# Patient Record
Sex: Male | Born: 1945 | Race: White | Hispanic: No | Marital: Married | Smoking: Former smoker
Health system: Southern US, Community
[De-identification: ages and names within clinical notes are randomized; demographics above are authoritative.]

## PROBLEM LIST (undated history)

## (undated) DIAGNOSIS — E119 Type 2 diabetes mellitus without complications: Secondary | ICD-10-CM

## (undated) DIAGNOSIS — F32A Depression, unspecified: Secondary | ICD-10-CM

## (undated) DIAGNOSIS — K219 Gastro-esophageal reflux disease without esophagitis: Secondary | ICD-10-CM

## (undated) DIAGNOSIS — F988 Other specified behavioral and emotional disorders with onset usually occurring in childhood and adolescence: Secondary | ICD-10-CM

## (undated) DIAGNOSIS — K589 Irritable bowel syndrome without diarrhea: Secondary | ICD-10-CM

## (undated) DIAGNOSIS — F419 Anxiety disorder, unspecified: Secondary | ICD-10-CM

## (undated) DIAGNOSIS — F329 Major depressive disorder, single episode, unspecified: Secondary | ICD-10-CM

## (undated) HISTORY — PX: OTHER SURGICAL HISTORY: SHX169

## (undated) HISTORY — PX: KNEE SURGERY: SHX244

## (undated) HISTORY — PX: APPENDECTOMY: SHX54

---

## 2012-05-18 HISTORY — PX: SHOULDER SURGERY: SHX246

## 2012-06-07 ENCOUNTER — Other Ambulatory Visit: Payer: Self-pay | Admitting: Orthopedic Surgery

## 2012-06-07 DIAGNOSIS — M25511 Pain in right shoulder: Secondary | ICD-10-CM

## 2012-06-11 ENCOUNTER — Ambulatory Visit
Admission: RE | Admit: 2012-06-11 | Discharge: 2012-06-11 | Disposition: A | Discharge status: Home / Self Care | Payer: No Typology Code available for payment source | Source: Ambulatory Visit | Attending: Orthopedic Surgery | Admitting: Orthopedic Surgery

## 2012-06-11 DIAGNOSIS — M25511 Pain in right shoulder: Secondary | ICD-10-CM

## 2012-07-28 ENCOUNTER — Emergency Department (HOSPITAL_COMMUNITY): Payer: Medicare Other

## 2012-07-28 ENCOUNTER — Inpatient Hospital Stay (HOSPITAL_COMMUNITY): Payer: Medicare Other

## 2012-07-28 ENCOUNTER — Inpatient Hospital Stay (HOSPITAL_COMMUNITY)
Admission: EM | Admit: 2012-07-28 | Discharge: 2012-07-30 | DRG: 917 | Disposition: A | Discharge status: Home / Self Care | Payer: Medicare Other | Attending: Internal Medicine | Admitting: Internal Medicine

## 2012-07-28 ENCOUNTER — Encounter (HOSPITAL_COMMUNITY): Payer: Self-pay | Admitting: Internal Medicine

## 2012-07-28 DIAGNOSIS — T400X1A Poisoning by opium, accidental (unintentional), initial encounter: Secondary | ICD-10-CM | POA: Diagnosis present

## 2012-07-28 DIAGNOSIS — F419 Anxiety disorder, unspecified: Secondary | ICD-10-CM | POA: Diagnosis present

## 2012-07-28 DIAGNOSIS — T40601A Poisoning by unspecified narcotics, accidental (unintentional), initial encounter: Secondary | ICD-10-CM

## 2012-07-28 DIAGNOSIS — R339 Retention of urine, unspecified: Secondary | ICD-10-CM | POA: Diagnosis present

## 2012-07-28 DIAGNOSIS — F988 Other specified behavioral and emotional disorders with onset usually occurring in childhood and adolescence: Secondary | ICD-10-CM | POA: Diagnosis present

## 2012-07-28 DIAGNOSIS — Y92009 Unspecified place in unspecified non-institutional (private) residence as the place of occurrence of the external cause: Secondary | ICD-10-CM

## 2012-07-28 DIAGNOSIS — K219 Gastro-esophageal reflux disease without esophagitis: Secondary | ICD-10-CM | POA: Diagnosis present

## 2012-07-28 DIAGNOSIS — Z9889 Other specified postprocedural states: Secondary | ICD-10-CM

## 2012-07-28 DIAGNOSIS — R0989 Other specified symptoms and signs involving the circulatory and respiratory systems: Secondary | ICD-10-CM | POA: Diagnosis present

## 2012-07-28 DIAGNOSIS — R0609 Other forms of dyspnea: Secondary | ICD-10-CM | POA: Diagnosis present

## 2012-07-28 DIAGNOSIS — G92 Toxic encephalopathy: Secondary | ICD-10-CM | POA: Diagnosis present

## 2012-07-28 DIAGNOSIS — K589 Irritable bowel syndrome without diarrhea: Secondary | ICD-10-CM | POA: Diagnosis present

## 2012-07-28 DIAGNOSIS — E119 Type 2 diabetes mellitus without complications: Secondary | ICD-10-CM | POA: Diagnosis present

## 2012-07-28 DIAGNOSIS — G8918 Other acute postprocedural pain: Secondary | ICD-10-CM

## 2012-07-28 DIAGNOSIS — F329 Major depressive disorder, single episode, unspecified: Secondary | ICD-10-CM

## 2012-07-28 DIAGNOSIS — F411 Generalized anxiety disorder: Secondary | ICD-10-CM

## 2012-07-28 DIAGNOSIS — R739 Hyperglycemia, unspecified: Secondary | ICD-10-CM

## 2012-07-28 DIAGNOSIS — F32A Depression, unspecified: Secondary | ICD-10-CM | POA: Diagnosis present

## 2012-07-28 DIAGNOSIS — R4182 Altered mental status, unspecified: Secondary | ICD-10-CM

## 2012-07-28 DIAGNOSIS — Z79899 Other long term (current) drug therapy: Secondary | ICD-10-CM

## 2012-07-28 DIAGNOSIS — G929 Unspecified toxic encephalopathy: Secondary | ICD-10-CM | POA: Diagnosis present

## 2012-07-28 DIAGNOSIS — F3289 Other specified depressive episodes: Secondary | ICD-10-CM | POA: Diagnosis present

## 2012-07-28 HISTORY — DX: Other specified behavioral and emotional disorders with onset usually occurring in childhood and adolescence: F98.8

## 2012-07-28 HISTORY — DX: Type 2 diabetes mellitus without complications: E11.9

## 2012-07-28 HISTORY — DX: Gastro-esophageal reflux disease without esophagitis: K21.9

## 2012-07-28 HISTORY — DX: Irritable bowel syndrome, unspecified: K58.9

## 2012-07-28 HISTORY — DX: Anxiety disorder, unspecified: F41.9

## 2012-07-28 HISTORY — DX: Depression, unspecified: F32.A

## 2012-07-28 HISTORY — DX: Major depressive disorder, single episode, unspecified: F32.9

## 2012-07-28 LAB — CBC
MCH: 31.4 pg (ref 26.0–34.0)
MCV: 87.9 fL (ref 78.0–100.0)
Platelets: 137 10*3/uL — ABNORMAL LOW (ref 150–400)
RDW: 12.1 % (ref 11.5–15.5)
WBC: 13.2 10*3/uL — ABNORMAL HIGH (ref 4.0–10.5)

## 2012-07-28 LAB — URINE MICROSCOPIC-ADD ON

## 2012-07-28 LAB — ACETAMINOPHEN LEVEL: Acetaminophen (Tylenol), Serum: <15 ug/mL (ref 10–30)

## 2012-07-28 LAB — MRSA PCR SCREENING: MRSA by PCR: NEGATIVE

## 2012-07-28 LAB — POCT I-STAT, CHEM 8
Calcium, Ion: 1.09 mmol/L — ABNORMAL LOW (ref 1.13–1.30)
Chloride: 98 mEq/L (ref 96–112)
HCT: 48 % (ref 39.0–52.0)
Potassium: 4.4 mEq/L (ref 3.5–5.1)

## 2012-07-28 LAB — GLUCOSE, CAPILLARY
Glucose-Capillary: 212 mg/dL — ABNORMAL HIGH (ref 70–99)
Glucose-Capillary: 155 mg/dL — ABNORMAL HIGH (ref 70–99)

## 2012-07-28 LAB — URINALYSIS, ROUTINE W REFLEX MICROSCOPIC
Leukocytes, UA: NEGATIVE
Nitrite: NEGATIVE
Specific Gravity, Urine: 1.03 (ref 1.005–1.030)
pH: 5.5 (ref 5.0–8.0)

## 2012-07-28 LAB — RAPID URINE DRUG SCREEN, HOSP PERFORMED: Barbiturates: NOT DETECTED

## 2012-07-28 MED ORDER — ENOXAPARIN SODIUM 40 MG/0.4ML ~~LOC~~ SOLN
40.0000 mg | Freq: Every day | SUBCUTANEOUS | Status: DC
  Administered 2012-07-28 – 2012-07-29 (×2): 40 mg via SUBCUTANEOUS
  Filled 2012-07-28 (×4): qty 0.4

## 2012-07-28 MED ORDER — ALPRAZOLAM 0.25 MG PO TABS: 0.2500 mg | ORAL_TABLET | Freq: Three times a day (TID) | ORAL | Status: DC | PRN

## 2012-07-28 MED ORDER — SODIUM CHLORIDE 0.9 % IJ SOLN
3.0000 mL | Freq: Two times a day (BID) | INTRAMUSCULAR | Status: DC
  Administered 2012-07-29 – 2012-07-30 (×3): 3 mL via INTRAVENOUS

## 2012-07-28 MED ORDER — METHYLPHENIDATE HCL 5 MG PO TABS
10.0000 mg | ORAL_TABLET | Freq: Two times a day (BID) | ORAL | Status: DC
  Administered 2012-07-29 – 2012-07-30 (×3): 10 mg via ORAL
  Filled 2012-07-28 (×2): qty 1
  Filled 2012-07-28 (×2): qty 2

## 2012-07-28 MED ORDER — SODIUM CHLORIDE 0.9 % IV BOLUS (SEPSIS)
500.0000 mL | Freq: Once | INTRAVENOUS | Status: AC
  Administered 2012-07-28: 500 mL via INTRAVENOUS

## 2012-07-28 MED ORDER — TIZANIDINE HCL 2 MG PO TABS
2.0000 mg | ORAL_TABLET | Freq: Four times a day (QID) | ORAL | Status: DC | PRN
  Filled 2012-07-28: qty 1

## 2012-07-28 MED ORDER — SENNOSIDES-DOCUSATE SODIUM 8.6-50 MG PO TABS
1.0000 | ORAL_TABLET | Freq: Every evening | ORAL | Status: DC | PRN
  Filled 2012-07-28: qty 1

## 2012-07-28 MED ORDER — NALOXONE HCL 0.4 MG/ML IJ SOLN
0.4000 mg | INTRAMUSCULAR | Status: DC | PRN
  Administered 2012-07-28: 0.4 mg via INTRAVENOUS
  Filled 2012-07-28: qty 1

## 2012-07-28 MED ORDER — METHYLPHENIDATE HCL 10 MG PO TABS: 10.0000 mg | ORAL_TABLET | Freq: Two times a day (BID) | ORAL | Status: DC

## 2012-07-28 MED ORDER — KETOROLAC TROMETHAMINE 30 MG/ML IJ SOLN
30.0000 mg | Freq: Four times a day (QID) | INTRAMUSCULAR | Status: DC
  Filled 2012-07-28 (×6): qty 1

## 2012-07-28 MED ORDER — ACETAMINOPHEN 325 MG PO TABS
650.0000 mg | ORAL_TABLET | Freq: Four times a day (QID) | ORAL | Status: DC | PRN
  Administered 2012-07-29 (×2): 650 mg via ORAL
  Filled 2012-07-28 (×4): qty 2

## 2012-07-28 MED ORDER — INSULIN ASPART 100 UNIT/ML ~~LOC~~ SOLN
0.0000 [IU] | Freq: Three times a day (TID) | SUBCUTANEOUS | Status: DC
  Administered 2012-07-28: 3 [IU] via SUBCUTANEOUS
  Administered 2012-07-29: 1 [IU] via SUBCUTANEOUS
  Administered 2012-07-29: 3 [IU] via SUBCUTANEOUS
  Administered 2012-07-29: 1 [IU] via SUBCUTANEOUS
  Administered 2012-07-30: 3 [IU] via SUBCUTANEOUS

## 2012-07-28 MED ORDER — SODIUM CHLORIDE 0.9 % IV SOLN
INTRAVENOUS | Status: DC
  Administered 2012-07-28 – 2012-07-29 (×3): via INTRAVENOUS

## 2012-07-28 MED ORDER — INSULIN ASPART 100 UNIT/ML ~~LOC~~ SOLN: 0.0000 [IU] | Freq: Every day | SUBCUTANEOUS | Status: DC

## 2012-07-28 MED ORDER — ONDANSETRON HCL 4 MG PO TABS: 4.0000 mg | ORAL_TABLET | Freq: Four times a day (QID) | ORAL | Status: DC | PRN

## 2012-07-28 MED ORDER — ACETAMINOPHEN 650 MG RE SUPP: 650.0000 mg | Freq: Four times a day (QID) | RECTAL | Status: DC | PRN

## 2012-07-28 MED ORDER — ONDANSETRON HCL 4 MG/2ML IJ SOLN: 4.0000 mg | Freq: Four times a day (QID) | INTRAMUSCULAR | Status: DC | PRN

## 2012-07-28 NOTE — ED Notes (Signed)
Patient states that he took 3 of his wife's MS Contin 30 mg tablets.

## 2012-07-28 NOTE — Progress Notes (Signed)
1830 Patient arrived to floor from ED via stretcher. Arousable but very drowsy. Placed on telemetry and bed alarm.
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
## 2012-07-28 NOTE — ED Notes (Signed)
Pt to department via EMS from home- pt reports he had surgery on his right shoulder yesterday. States he checked out AMA from rehab because pain medication was not strong enough and came home. Pt was checked on by house keeper and was lethargic with decreased RR. Ems states they gave 35m narcan and pt was more responsive. Bp-140/80 Hr-100

## 2012-07-28 NOTE — Progress Notes (Signed)
Pt has a foley but no order for a foley. RN paged md on call and is awaiting further orders.
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
## 2012-07-29 LAB — CBC
Hemoglobin: 14.8 g/dL (ref 13.0–17.0)
MCH: 30.3 pg (ref 26.0–34.0)
Platelets: 119 10*3/uL — ABNORMAL LOW (ref 150–400)
RBC: 4.89 MIL/uL (ref 4.22–5.81)
WBC: 10.1 10*3/uL (ref 4.0–10.5)

## 2012-07-29 LAB — BASIC METABOLIC PANEL
Calcium: 8.4 mg/dL (ref 8.4–10.5)
GFR calc non Af Amer: 89 mL/min — ABNORMAL LOW (ref >90)
Glucose, Bld: 163 mg/dL — ABNORMAL HIGH (ref 70–99)
Potassium: 3.9 mEq/L (ref 3.5–5.1)
Sodium: 137 mEq/L (ref 135–145)

## 2012-07-29 LAB — GLUCOSE, CAPILLARY
Glucose-Capillary: 140 mg/dL — ABNORMAL HIGH (ref 70–99)
Glucose-Capillary: 203 mg/dL — ABNORMAL HIGH (ref 70–99)

## 2012-07-29 LAB — HEMOGLOBIN A1C: Mean Plasma Glucose: 364 mg/dL — ABNORMAL HIGH (ref <117)

## 2012-07-29 MED ORDER — KETOROLAC TROMETHAMINE 30 MG/ML IJ SOLN
15.0000 mg | Freq: Four times a day (QID) | INTRAMUSCULAR | Status: DC
  Filled 2012-07-29 (×4): qty 1

## 2012-07-29 MED ORDER — HYDROCODONE-ACETAMINOPHEN 7.5-325 MG PO TABS: 1.0000 | ORAL_TABLET | Freq: Four times a day (QID) | ORAL | Status: DC | PRN

## 2012-07-29 MED ORDER — ACETAMINOPHEN 500 MG PO TABS
1000.0000 mg | ORAL_TABLET | Freq: Three times a day (TID) | ORAL | Status: DC
  Administered 2012-07-29 – 2012-07-30 (×3): 1000 mg via ORAL
  Filled 2012-07-29 (×7): qty 2

## 2012-07-29 MED ORDER — TAMSULOSIN HCL 0.4 MG PO CAPS
0.4000 mg | ORAL_CAPSULE | Freq: Every day | ORAL | Status: DC
  Administered 2012-07-29 – 2012-07-30 (×2): 0.4 mg via ORAL
  Filled 2012-07-29 (×2): qty 1

## 2012-07-29 MED ORDER — DOCUSATE SODIUM 100 MG PO CAPS
100.0000 mg | ORAL_CAPSULE | Freq: Two times a day (BID) | ORAL | Status: DC
  Administered 2012-07-29 – 2012-07-30 (×3): 100 mg via ORAL
  Filled 2012-07-29 (×4): qty 1

## 2012-07-29 MED ORDER — IBUPROFEN 200 MG PO TABS: 800.0000 mg | ORAL_TABLET | Freq: Four times a day (QID) | ORAL | Status: DC | PRN

## 2012-07-29 MED ORDER — OXYCODONE HCL 5 MG PO TABS
5.0000 mg | ORAL_TABLET | Freq: Four times a day (QID) | ORAL | Status: DC | PRN
  Administered 2012-07-30: 5 mg via ORAL
  Filled 2012-07-29: qty 1

## 2012-07-29 MED ORDER — DSS 100 MG PO CAPS: 100.0000 mg | ORAL_CAPSULE | Freq: Two times a day (BID) | ORAL | Status: DC

## 2012-07-29 MED ORDER — METHYLPHENIDATE HCL 10 MG PO TABS: 10.0000 mg | ORAL_TABLET | Freq: Two times a day (BID) | ORAL | Status: DC

## 2012-07-29 MED ORDER — SIMETHICONE 80 MG PO CHEW
80.0000 mg | CHEWABLE_TABLET | Freq: Four times a day (QID) | ORAL | Status: DC | PRN
  Filled 2012-07-29: qty 1

## 2012-07-29 MED ORDER — TIZANIDINE HCL 2 MG PO TABS: 2.0000 mg | ORAL_TABLET | Freq: Four times a day (QID) | ORAL | Status: DC | PRN

## 2012-07-29 MED ORDER — KETOROLAC TROMETHAMINE 15 MG/ML IJ SOLN
15.0000 mg | Freq: Four times a day (QID) | INTRAMUSCULAR | Status: DC
  Filled 2012-07-29 (×4): qty 1

## 2012-07-29 MED ORDER — SENNOSIDES-DOCUSATE SODIUM 8.6-50 MG PO TABS
1.0000 | ORAL_TABLET | Freq: Every day | ORAL | Status: DC
  Administered 2012-07-29: 1 via ORAL
  Filled 2012-07-29 (×2): qty 1

## 2012-07-29 MED ORDER — PANTOPRAZOLE SODIUM 40 MG PO TBEC
40.0000 mg | DELAYED_RELEASE_TABLET | Freq: Every day | ORAL | Status: DC
  Administered 2012-07-30: 40 mg via ORAL
  Filled 2012-07-29: qty 1

## 2012-07-29 MED ORDER — PIOGLITAZONE HCL 15 MG PO TABS
15.0000 mg | ORAL_TABLET | Freq: Every day | ORAL | Status: DC
  Filled 2012-07-29 (×2): qty 1

## 2012-07-29 MED ORDER — LIVING WELL WITH DIABETES BOOK
Freq: Once | Status: AC
  Administered 2012-07-29: 19:00:00
  Filled 2012-07-29 (×2): qty 1

## 2012-07-29 NOTE — Progress Notes (Signed)
RN found a bag of medications in the patient's room. The medications were some of the medications that the patient used to overdose on. Pt had some of his deceased wife's medications in the bag. Medications were counted and sent down to pharmacy.

## 2012-07-29 NOTE — Care Management Note (Unsigned)
    Page 1 of 2   07/29/2012     3:11:24 PM   CARE MANAGEMENT NOTE 07/29/2012  Patient:  Shane Casey, Shane Casey   Account Number:  [id number]  Date Initiated:  07/29/2012  Documentation initiated by:  Letha Cape  Subjective/Objective Assessment:   dx opiate od  admit- lives alone.     Action/Plan:   hhrn   Anticipated DC Date:  07/30/2012   Anticipated DC Plan:  HOME W HOME HEALTH SERVICES      DC Planning Services  CM consult      [facility] Choice  HOME HEALTH   Choice offered to / List presented to:  C-1 Patient        HH arranged  HH-1 RN  HH-4 NURSE'S AIDE      HH agency  [facility]   Status of service:  In process, will continue to follow Medicare Important Message given?   (If response is "NO", the following Medicare IM given date fields will be blank) Date Medicare IM given:   Date Additional Medicare IM given:    Discharge Disposition:    Per UR Regulation:  Reviewed for med. necessity/level of care/duration of stay  If discussed at Long Length of Stay Meetings, dates discussed:    Comments:  07/29/12 12:27 Letha Cape RN, BSN [phone] patient lives alone, he has a housekeeper , Shane Casey [phone], who states she will stay will him Sat night and she will transport him from the hospital to home on Sat as well.  Shane Casey states that she will not be able to stay with him on Sunday, but pt states he has a friend who is a trainer that he can ask to stay with him.  Also we will check the private duty list to see if they have someone to stay overnight with patient, he states he is willing to pay $10-$15 /hr, but will just need someone from Sunday to Monday.  I will check into some of the private duty agencies.  Patient chose [facility] from agency list, referral made to [name] , Shane Casey notified for [facility] and aide.   Per physical therapy no pt  needs.  I called [facility]  [phone] , they state they can have someone to sit with patient on Sunday night thru Monday and  it will be $17-$18 /hr, the patient will need to call them on Saturday when he gets home so they can come and do an assessment.  I gave the pt the private duty list and this information to call [facility] for a sitter on Sunday night.

## 2012-07-29 NOTE — Progress Notes (Signed)
PT Cancellation Note  Patient Details Name: Shane Casey MRN: [id number] DOB: 09/07/1945   Cancelled Treatment:    Reason Eval/Treat Not Completed: Other (comment) (Pt amb in hall without difficulty. No PT eval needed.) Recommend pt follow-up with orthopedist for any further PT needed for shoulder.   MAYCOCK,CARY 07/29/2012, 12:35 PM

## 2012-07-29 NOTE — Progress Notes (Signed)
Foley was removed per MD orders. Pt tolerated well

## 2012-07-29 NOTE — Progress Notes (Signed)
Inpatient Diabetes Program Recommendations  AACE/ADA: New Consensus Statement on Inpatient Glycemic Control (2013)  Target Ranges:  Prepandial:   less than 140 mg/dL      Peak postprandial:   less than 180 mg/dL (1-2 hours)      Critically ill patients:  140 - 180 mg/dL   Patient admitted with unintentional opiate overdose.  Has history of type 2 diabetes.  No mention of diabetes medications at home per med rec.  Results for Shane Casey, Shane Casey (MRN [id number]) as of 07/29/2012 14:31  Ref. Range 07/28/2012 14:14  Hemoglobin A1C Latest Range: <5.7 % 14.3 (H)   A1c shows extremely poor glucose control at home.    Discussed A1C results with him and explained what an A1C is and what it measures.  Also discussed basic home care and importance of checking CBGs and maintaining good CBG control to prevent long-term and short-term complications.  Discussed basic pathophysiology of diabetes and the chronic complications that can ensue from uncontrolled CBGs.  RNs to provide ongoing basic DM education at bedside with this patient.   Patient really seemed to have a major lack of understanding of his diabetes.  Patient told me he had taken Glucophage and Amaryl that was prescribed by his PCP, however, he stopped taking these medications a long time ago b/c they gave him "really bad diarrhea".  Patient also told me he really hasn't done anything about his diabetes b/c he really doesn't understand it.  Patient asked me if he could go see a diabetes specialist.  I went ahead and made an appointment with Dr. Ernest Haber with [facility] for March 28 at 8am.  Will also place a consult for outpatient diabetes education for this patient at the [facility] Nutrition and DM management center.   Will follow. Ambrose Finland RN, MSN, CDE Diabetes Coordinator Inpatient Diabetes Program [phone]

## 2012-07-29 NOTE — Discharge Summary (Signed)
Physician Discharge Summary  Draylon Mercadel WJX:[id number] DOB: 01/02/1946 DOA: 07/28/2012  PCP: Pamelia Hoit, MD  Admit date: 07/28/2012 Discharge date: 07/30/2012  Time spent: 60 minutes  Recommendations for Outpatient Follow-up:  Patient will be discharged with full home health services (RN, PT, Aide, Social Worker) Follow up with Dr. Madelon Lips 3/17 at 8:45 am for post surgical evaluation  Discharge Diagnoses:  Principal Problem:   Opiate overdose-unintentional Active Problems:   ADD (attention deficit disorder) without hyperactivity   Depression   Anxiety   DM type 2 (diabetes mellitus, type 2)   GERD (gastroesophageal reflux disease)   IBS (irritable bowel syndrome)   S/P shoulder surgery   Urinary retention   Discharge Condition: stable.  Diet recommendation: diabetic diet  Filed Weights   07/28/12 1445 07/28/12 1830  Weight: 89.359 kg (197 lb) 89.4 kg (197 lb 1.5 oz)    History of present illness:  Atlee Kluth is a 67 y.o. male with history of attention deficit disorder, depression, anxiety, untreated diabetes mellitus type 2, who underwent a right shoulder surgery July 27, 2012. Postoperatively the patient became combative and fought the staff at the orthopedic at the surgical center for 2 hours straight. Eventually he was released home. Apparently he arrived at his house and took an unspecified amount of MS Contin which he had stored previously for "rainy days". The medication belonged to his deceased wife (who passed in 1995-10-25). EMS arrived at his house and found him with agonal breathing. Patient was given Narcan and he was transported to the emergency room. Hospitalist service was called to admit this patient  Hospital Course:  Opiate Overdose: Mr. Hutzler received several doses of narcan.  He was placed on IV fluids, telemetry monitoring, and continuous pulse oximetry. The morning after admission he is alert and orientated. Vital signs are appropriate.  He  reports he has no suicidal ideations, but rather took the medication because he was in intractable pain. He was placed on IV toradol initially, this was then discontinued and patient was placed on scheduled Tylenol and as needed oxycodone. Over the past 24 hours-pain seems to be adequately controlled, he has just use one dose of 5 mg of Oxycodone for breakthrough. He has been ambulating in the room without difficulty.  He had all his medications here in the hospital-they are with the pharmacy staff-I have instructed the RN to dispose of all the narcotics-as they apparently  his late wife's supply, per RN these were already past their expiration date. He has been asked to take Tylenol and prn Ibuprofen for pain. He will be provided with 15 tablets of Oxycodone for breakthrough.He lives alone, but he has a housekeeper that will pick him up and stay with him for the next few days. He has been extensively educated by me today and yesterday to be very careful with narcotics.He is aware of potential life threatening effects of narcotic overdose.  Recent right shoulder/rotator cuff surgery -pain issues as above -Case was discussed with Dr. Odette Horns needs to followup with him next week  DM CBG is currently controlled with SSI while inpatient-currently not symptomatic with Polyuria or polydipsia -A1C is 14!! Claims he has not tolerated Metformin and Glimiperide in the past-was very hesitant to go back on meds-finally after explaining risks of uncontrolled DM-he agreed to try Actos-has outpatient appointment with Endo scheduled next week  ADD (attention deficit disorder) without hyperactivity  -Stable at present  -On Ritalin  Acute urinary retention  -In and out catheter done 3/15,  started Flomax-this has resolved-patient now urinating without any difficulty   Social work consultation was requested as Mr. Cianci appears to struggle with anxiety and depression that seems to stem from the  passing of his wife in 25.He is very hesitant in starting in any meds-he has no suicidal or homicidal ideations-we will discharge him back to the care of his PCP for further care  With Mr. Pardon permission - discussed his case with his sister Marylu Lund who lives out of state on 3/15   Discharge Exam: Filed Vitals:   07/29/12 0900 07/29/12 1400 07/29/12 1827 07/29/12 2112  BP: 123/74 131/83 125/77 143/78  Pulse: 75 82 85 92  Temp: 98 F (36.7 C) 98.1 F (36.7 C) 97.4 F (36.3 C) 98.4 F (36.9 C)  TempSrc: Oral Oral Oral Oral  Resp: 16 18 18 18   Height:      Weight:      SpO2: 97% 96% 95% 94%    General: A&O, NAD, Lying comfortably in bed. Cardiovascular: rrr no m/r/g, no lower ext swelling Respiratory: cta, no w/c/r Abdomen:  Mass on the right (likely stool), Non tender, decreased bowel sounds. Skin:  No rash, bruise, lesion Right shoulder wrapped in gauze.  Bandage clean and dry.Incision site looks clean without any discharge as well.  Discharge Instructions      Discharge Orders   Future Appointments Provider Department Dept Phone   08/12/2012 8:00 AM Carlus Pavlov, MD [facility] PRIMARY [facility] ENDOCRINOLOGY [phone]   Future Orders Complete By Expires     Ambulatory referral to Nutrition and Diabetic Education  As directed     Comments:      History of Type 2 diabetes.  A1c 14.3% (07/28/12).  PCP Dr. Benedetto Goad.  Has appointment with Dr. Ernest Haber on 03/28.    Call MD for:  severe uncontrolled pain  As directed     Diet - low sodium heart healthy  As directed     Increase activity slowly  As directed         Medication List    STOP taking these medications       HYDROcodone-acetaminophen 7.5-325 MG per tablet  Commonly known as:  NORCO      TAKE these medications       acetaminophen 500 MG tablet  Commonly known as:  TYLENOL  Take 2 tablets (1,000 mg total) by mouth every 8 (eight) hours as needed.     DSS 100 MG Caps  Take 100 mg by mouth 2  (two) times daily.     ibuprofen 200 MG tablet  Commonly known as:  MOTRIN IB  Take 4 tablets (800 mg total) by mouth every 6 (six) hours as needed for pain.     methylphenidate 10 MG tablet  Commonly known as:  RITALIN  Take 1 tablet (10 mg total) by mouth 2 (two) times daily with breakfast and lunch.     oxyCODONE 5 MG immediate release tablet  Commonly known as:  Oxy IR/ROXICODONE  Take 1 tablet (5 mg total) by mouth every 6 (six) hours as needed.     pioglitazone 15 MG tablet  Commonly known as:  ACTOS  Take 1 tablet (15 mg total) by mouth daily with breakfast.     tamsulosin 0.4 MG Caps  Commonly known as:  FLOMAX  Take 1 capsule (0.4 mg total) by mouth daily.       Follow-up Information   Follow up with Pamelia Hoit, MD. Schedule an appointment as soon  as possible for a visit in 1 week.   Contact information:   [address] [place] [address] [phone]       Follow up with Carlus Pavlov, MD On 08/12/2012. (appointmet at 8 am)    Contact information:   [address] [place] [address] [phone]       Follow up with CAFFREY JR,W D, MD On 08/01/2012. (appointment at 8:45 am)    Contact information:   [address] [place] [address] [phone]        The results of significant diagnostics from this hospitalization (including imaging, microbiology, ancillary and laboratory) are listed below for reference.    Significant Diagnostic Studies: Dg Chest 1 View  07/28/2012  *RADIOLOGY REPORT*  Clinical Data: Altered mental status.  Question aspiration.  CHEST - 1 VIEW  Comparison: None.  Findings: Heart is normal size.  Linear densities in the lung bases, right greater than left, likely scarring or atelectasis.  No confluent opacities otherwise.  No effusions.  No acute bony abnormality.  IMPRESSION: Bibasilar scarring or atelectasis.   Original Report Authenticated By: Charlett Nose, M.D.    Ct Head Wo  Contrast  07/28/2012  *RADIOLOGY REPORT*  Clinical Data: Mental status changes.  CT HEAD WITHOUT CONTRAST  Technique:  Contiguous axial images were obtained from the base of the skull through the vertex without contrast.  Comparison: None.  Findings: There is atrophy and chronic small vessel disease changes. No acute intracranial abnormality.  Specifically, no hemorrhage, hydrocephalus, mass lesion, acute infarction, or significant intracranial injury.  No acute calvarial abnormality.  IMPRESSION: No acute intracranial abnormality.   Original Report Authenticated By: Charlett Nose, M.D.     Microbiology: Recent Results (from the past 240 hour(s))  MRSA PCR SCREENING     Status: None   Collection Time    07/28/12  8:57 PM      Result Value Range Status   MRSA by PCR NEGATIVE  NEGATIVE Final   Comment:            The GeneXpert MRSA Assay (FDA     approved for NASAL specimens     only), is one component of a     comprehensive MRSA colonization     surveillance program. It is not     intended to diagnose MRSA     infection nor to guide or     monitor treatment for     MRSA infections.     Labs: Basic Metabolic Panel:  Recent Labs Lab 07/28/12 0925 07/29/12 0550 07/30/12 0500  NA 131* 137 135  K 4.4 3.9 3.5  CL 98 100 97  CO2  --  23 21  GLUCOSE 331* 163* 178*  BUN 11 13 13   CREATININE 0.90 0.85 0.79  CALCIUM  --  8.4 8.6   CBC:  Recent Labs Lab 07/28/12 0915 07/28/12 0925 07/29/12 0550 07/30/12 0500  WBC 13.2*  --  10.1 7.6  HGB 15.9 16.3 14.8 14.1  HCT 44.5 48.0 44.3 40.9  MCV 87.9  --  90.6 87.4  PLT 137*  --  119* 128*   CBG:  Recent Labs Lab 07/29/12 0753 07/29/12 1156 07/29/12 1706 07/29/12 2156 07/30/12 0752  GLUCAP 140* 203* 127* 148* 167*    Signed:  Evaristo Bury [phone]  [facility] 07/30/2012, 11:20 AM  Attending Patient seen and examined, agree with the assessment and plan as outline above. Neccessary edits to the  above  documentation have been made. He is stable for discharge today  S Ghimire

## 2012-07-29 NOTE — Progress Notes (Signed)
PATIENT DETAILS Name: Shane Casey Age: 67 y.o. Sex: male Date of Birth: 07/04/1945 Admit Date: 07/28/2012 Admitting Physician Sorin Luanne Bras, MD [id number] Sherilyn Cooter, MD  Subjective: Awake alert  Assessment/Plan: Principal Problem:  Acute encephalopathy -Secondary to opiate overdose-unintentional -Encephalopathy has completely resolved  Active Problems: Recent right shoulder/rotator cuff surgery -Per history obtained-patient had increasing pain post surgery-and decided to take oral narcotics that were left over from his deceased wife supply -Use Tylenol for pain and as needed oxycodone -Case was discussed with Dr. Odette Horns needs to followup with him next week    ADD (attention deficit disorder) without hyperactivity -Stable at present -On Ritalin  Acute urinary retention -In and out catheter, start Flomax -If it reoccurs-Foley catheter will need to be inserted    Depression/  Anxiety -Continue with Xanax    DM type 2 (diabetes mellitus, type 2) -CBG is currently controlled with SSI  Disposition: Remain inpatient-home in a.m.  DVT Prophylaxis: Prophylactic Lovenox   Code Status: Full code  Procedures:  None  CONSULTS:  None  PHYSICAL EXAM: Vital signs in last 24 hours: Filed Vitals:   07/28/12 1830 07/28/12 2119 07/29/12 0500 07/29/12 0900  BP: 157/75 153/82 103/67 123/74  Pulse: 105 94 73 75  Temp: 98.6 F (37 C) 97.8 F (36.6 C) 98.9 F (37.2 C) 98 F (36.7 C)  TempSrc: Oral Oral Oral Oral  Resp: 15 16 18 16   Height: 6\' 1"  (1.854 m)     Weight: 89.4 kg (197 lb 1.5 oz)     SpO2: 98% 96% 94% 97%    Weight change:  Body mass index is 26.01 kg/(m^2).   Gen Exam: Awake and alert with clear speech.   Neck: Supple, No JVD.   Chest: B/L Clear.   CVS: S1 S2 Regular, no murmurs.  Abdomen: soft, BS +, non tender, non distended.  Extremities: no edema, lower extremities warm to touch. Neurologic: Non Focal.   Skin: No Rash.    Wounds: N/A.   Intake/Output from previous day:  Intake/Output Summary (Last 24 hours) at 07/29/12 1449 Last data filed at 07/29/12 0815  Gross per 24 hour  Intake   2890 ml  Output      0 ml  Net   2890 ml     LAB RESULTS: CBC  Recent Labs Lab 07/28/12 0915 07/28/12 0925 07/29/12 0550  WBC 13.2*  --  10.1  HGB 15.9 16.3 14.8  HCT 44.5 48.0 44.3  PLT 137*  --  119*  MCV 87.9  --  90.6  MCH 31.4  --  30.3  MCHC 35.7  --  33.4  RDW 12.1  --  12.3    Chemistries   Recent Labs Lab 07/28/12 0925 07/29/12 0550  NA 131* 137  K 4.4 3.9  CL 98 100  CO2  --  23  GLUCOSE 331* 163*  BUN 11 13  CREATININE 0.90 0.85  CALCIUM  --  8.4    CBG:  Recent Labs Lab 07/28/12 1817 07/28/12 2118 07/29/12 0753 07/29/12 1156  GLUCAP 212* 155* 140* 203*    GFR Estimated Creatinine Clearance: 97.9 ml/min (by C-G formula based on Cr of 0.85).  Coagulation profile No results found for this basename: INR, PROTIME,  in the last 168 hours  Cardiac Enzymes No results found for this basename: CK, CKMB, TROPONINI, MYOGLOBIN,  in the last 168 hours  No components found with this basename: POCBNP,  No results found for this basename: DDIMER,  in the last  72 hours  Recent Labs  07/28/12 1414  HGBA1C 14.3*   No results found for this basename: CHOL, HDL, LDLCALC, TRIG, CHOLHDL, LDLDIRECT,  in the last 72 hours No results found for this basename: TSH, T4TOTAL, FREET3, T3FREE, THYROIDAB,  in the last 72 hours No results found for this basename: VITAMINB12, FOLATE, FERRITIN, TIBC, IRON, RETICCTPCT,  in the last 72 hours No results found for this basename: LIPASE, AMYLASE,  in the last 72 hours  Urine Studies No results found for this basename: UACOL, UAPR, USPG, UPH, UTP, UGL, UKET, UBIL, UHGB, UNIT, UROB, ULEU, UEPI, UWBC, URBC, UBAC, CAST, CRYS, UCOM, BILUA,  in the last 72 hours  MICROBIOLOGY: Recent Results (from the past 240 hour(s))  MRSA PCR SCREENING     Status:  None   Collection Time    07/28/12  8:57 PM      Result Value Range Status   MRSA by PCR NEGATIVE  NEGATIVE Final   Comment:            The GeneXpert MRSA Assay (FDA     approved for NASAL specimens     only), is one component of a     comprehensive MRSA colonization     surveillance program. It is not     intended to diagnose MRSA     infection nor to guide or     monitor treatment for     MRSA infections.    RADIOLOGY STUDIES/RESULTS: Dg Chest 1 View  07/28/2012  *RADIOLOGY REPORT*  Clinical Data: Altered mental status.  Question aspiration.  CHEST - 1 VIEW  Comparison: None.  Findings: Heart is normal size.  Linear densities in the lung bases, right greater than left, likely scarring or atelectasis.  No confluent opacities otherwise.  No effusions.  No acute bony abnormality.  IMPRESSION: Bibasilar scarring or atelectasis.   Original Report Authenticated By: Charlett Nose, M.D.    Ct Head Wo Contrast  07/28/2012  *RADIOLOGY REPORT*  Clinical Data: Mental status changes.  CT HEAD WITHOUT CONTRAST  Technique:  Contiguous axial images were obtained from the base of the skull through the vertex without contrast.  Comparison: None.  Findings: There is atrophy and chronic small vessel disease changes. No acute intracranial abnormality.  Specifically, no hemorrhage, hydrocephalus, mass lesion, acute infarction, or significant intracranial injury.  No acute calvarial abnormality.  IMPRESSION: No acute intracranial abnormality.   Original Report Authenticated By: Charlett Nose, M.D.     MEDICATIONS: Scheduled Meds: . acetaminophen  1,000 mg Oral TID  . docusate sodium  100 mg Oral BID  . enoxaparin (LOVENOX) injection  40 mg Subcutaneous q1800  . insulin aspart  0-5 Units Subcutaneous QHS  . insulin aspart  0-9 Units Subcutaneous TID WC  . methylphenidate  10 mg Oral BID WC  . senna-docusate  1 tablet Oral QHS  . sodium chloride  3 mL Intravenous Q12H  . tamsulosin  0.4 mg Oral Daily    Continuous Infusions: . sodium chloride 150 mL/hr at 07/29/12 0844   PRN Meds:.acetaminophen, acetaminophen, ALPRAZolam, naLOXone (NARCAN)  injection, ondansetron (ZOFRAN) IV, ondansetron, oxyCODONE, senna-docusate, tiZANidine  Antibiotics: Anti-infectives   None       Jeoffrey Massed, MD  [facility] Pager:[phone]  If 7PM-7AM, please contact night-coverage [email or web address] Password [email or web address] 07/29/2012, 2:49 PM   LOS: 1 day

## 2012-07-30 LAB — GLUCOSE, CAPILLARY: Glucose-Capillary: 203 mg/dL — ABNORMAL HIGH (ref 70–99)

## 2012-07-30 LAB — BASIC METABOLIC PANEL
BUN: 13 mg/dL (ref 6–23)
CO2: 21 mEq/L (ref 19–32)
Calcium: 8.6 mg/dL (ref 8.4–10.5)
GFR calc non Af Amer: >90 mL/min (ref >90)
Glucose, Bld: 178 mg/dL — ABNORMAL HIGH (ref 70–99)
Potassium: 3.5 mEq/L (ref 3.5–5.1)

## 2012-07-30 LAB — CBC
HCT: 40.9 % (ref 39.0–52.0)
Hemoglobin: 14.1 g/dL (ref 13.0–17.0)
MCH: 30.1 pg (ref 26.0–34.0)
MCHC: 34.5 g/dL (ref 30.0–36.0)

## 2012-07-30 MED ORDER — OXYCODONE HCL 5 MG PO TABS: 5.0000 mg | ORAL_TABLET | Freq: Four times a day (QID) | ORAL | Status: DC | PRN

## 2012-07-30 MED ORDER — ACETAMINOPHEN 500 MG PO TABS: 1000.0000 mg | ORAL_TABLET | Freq: Three times a day (TID) | ORAL | Status: DC

## 2012-07-30 MED ORDER — DSS 100 MG PO CAPS: 100.0000 mg | ORAL_CAPSULE | Freq: Two times a day (BID) | ORAL | Status: DC

## 2012-07-30 MED ORDER — PIOGLITAZONE HCL 15 MG PO TABS: 15.0000 mg | ORAL_TABLET | Freq: Every day | ORAL | Status: DC

## 2012-07-30 MED ORDER — IBUPROFEN 200 MG PO TABS
800.0000 mg | ORAL_TABLET | Freq: Four times a day (QID) | ORAL | Status: DC | PRN
Start: 2012-07-30 — End: 2015-04-29

## 2012-07-30 MED ORDER — METHYLPHENIDATE HCL 10 MG PO TABS: 10.0000 mg | ORAL_TABLET | Freq: Two times a day (BID) | ORAL | Status: AC

## 2012-07-30 MED ORDER — TAMSULOSIN HCL 0.4 MG PO CAPS: 0.4000 mg | ORAL_CAPSULE | Freq: Every day | ORAL | Status: DC

## 2012-07-30 MED ORDER — ACETAMINOPHEN 500 MG PO TABS: 1000.0000 mg | ORAL_TABLET | Freq: Three times a day (TID) | ORAL | Status: DC | PRN

## 2012-07-30 NOTE — Progress Notes (Addendum)
PATIENT DETAILS Name: Shane Casey Age: 67 y.o. Sex: male Date of Birth: April 02, 1946 Admit Date: 07/28/2012 Admitting Physician Sorin Luanne Bras, MD [id number] Sherilyn Cooter, MD  Subjective: Awake alert-pain controlled with Tylenol-only took one dose of Oxycodone for breakthrough over the past 24 hours. Urinated without difficulty yesterday.  Assessment/Plan: Principal Problem:  Acute encephalopathy -Secondary to opiate overdose-unintentional -Encephalopathy has completely resolved -patient apparently had all of his meds including all his narcotics at bedside-I have asked the RN to dispose of his his narcotics-prior to discharge. We need to simplify his regimen on discharge-as needed Tylenol or Ibuprofen-and as needed oxycodone for breakthrough.  He apparently had hydrocodone/APAP-9 tablets with him-per RN count-I will dispose this off as well-as he will be taking tylenol prn as well.   Active Problems: Recent right shoulder/rotator cuff surgery -Per history obtained-patient had increasing pain post surgery-and decided to take oral narcotics that were left over from his deceased wife supply -Pain adequately controlled with 1000 mg Tylenol TID and as needed oxycodone -Case was discussed with Dr. Odette Horns needs to followup with him next week    ADD (attention deficit disorder) without hyperactivity -Stable at present -On Ritalin  Acute urinary retention -In and out catheter done 3/15, started Flomax-this has resolved-patient now urinating without any difficulty    Depression/  Anxiety -Continue with Xanax    DM type 2 (diabetes mellitus, type 2) -CBG is currently controlled with SSI -A1C is 14!! Claims he has not tolerated Metformin and Glimiperide in the past-was very hesitant to go back on meds-finally after explaining risks of uncontrolled DM-he agreed to try Actos-has outpatient appointment with Endo scheduled next week  Disposition: Remain inpatient-home  today  DVT Prophylaxis: Prophylactic Lovenox   Code Status: Full code  Procedures:  None  CONSULTS:  None  PHYSICAL EXAM: Vital signs in last 24 hours: Filed Vitals:   07/29/12 0900 07/29/12 1400 07/29/12 1827 07/29/12 2112  BP: 123/74 131/83 125/77 143/78  Pulse: 75 82 85 92  Temp: 98 F (36.7 C) 98.1 F (36.7 C) 97.4 F (36.3 C) 98.4 F (36.9 C)  TempSrc: Oral Oral Oral Oral  Resp: 16 18 18 18   Height:      Weight:      SpO2: 97% 96% 95% 94%    Weight change:  Body mass index is 26.01 kg/(m^2).   Gen Exam: Awake and alert with clear speech.   Neck: Supple, No JVD.   Chest: B/L Clear.   CVS: S1 S2 Regular, no murmurs.  Abdomen: soft, BS +, non tender, non distended.  Extremities: no edema, lower extremities warm to touch. Neurologic: Non Focal.   Skin: No Rash.   Wounds: N/A. Right shoulder operative site-no discharge or overlying erythema  Intake/Output from previous day:  Intake/Output Summary (Last 24 hours) at 07/30/12 1051 Last data filed at 07/29/12 2100  Gross per 24 hour  Intake 1503.5 ml  Output   1100 ml  Net  403.5 ml     LAB RESULTS: CBC  Recent Labs Lab 07/28/12 0915 07/28/12 0925 07/29/12 0550 07/30/12 0500  WBC 13.2*  --  10.1 7.6  HGB 15.9 16.3 14.8 14.1  HCT 44.5 48.0 44.3 40.9  PLT 137*  --  119* 128*  MCV 87.9  --  90.6 87.4  MCH 31.4  --  30.3 30.1  MCHC 35.7  --  33.4 34.5  RDW 12.1  --  12.3 12.2    Chemistries   Recent Labs Lab 07/28/12 0925 07/29/12 0550 07/30/12  0500  NA 131* 137 135  K 4.4 3.9 3.5  CL 98 100 97  CO2  --  23 21  GLUCOSE 331* 163* 178*  BUN 11 13 13   CREATININE 0.90 0.85 0.79  CALCIUM  --  8.4 8.6    CBG:  Recent Labs Lab 07/29/12 0753 07/29/12 1156 07/29/12 1706 07/29/12 2156 07/30/12 0752  GLUCAP 140* 203* 127* 148* 167*    GFR Estimated Creatinine Clearance: 104 ml/min (by C-G formula based on Cr of 0.79).  Coagulation profile No results found for this basename:  INR, PROTIME,  in the last 168 hours  Cardiac Enzymes No results found for this basename: CK, CKMB, TROPONINI, MYOGLOBIN,  in the last 168 hours  No components found with this basename: POCBNP,  No results found for this basename: DDIMER,  in the last 72 hours  Recent Labs  07/28/12 1414  HGBA1C 14.3*   No results found for this basename: CHOL, HDL, LDLCALC, TRIG, CHOLHDL, LDLDIRECT,  in the last 72 hours No results found for this basename: TSH, T4TOTAL, FREET3, T3FREE, THYROIDAB,  in the last 72 hours No results found for this basename: VITAMINB12, FOLATE, FERRITIN, TIBC, IRON, RETICCTPCT,  in the last 72 hours No results found for this basename: LIPASE, AMYLASE,  in the last 72 hours  Urine Studies No results found for this basename: UACOL, UAPR, USPG, UPH, UTP, UGL, UKET, UBIL, UHGB, UNIT, UROB, ULEU, UEPI, UWBC, URBC, UBAC, CAST, CRYS, UCOM, BILUA,  in the last 72 hours  MICROBIOLOGY: Recent Results (from the past 240 hour(s))  MRSA PCR SCREENING     Status: None   Collection Time    07/28/12  8:57 PM      Result Value Range Status   MRSA by PCR NEGATIVE  NEGATIVE Final   Comment:            The GeneXpert MRSA Assay (FDA     approved for NASAL specimens     only), is one component of a     comprehensive MRSA colonization     surveillance program. It is not     intended to diagnose MRSA     infection nor to guide or     monitor treatment for     MRSA infections.    RADIOLOGY STUDIES/RESULTS: Dg Chest 1 View  07/28/2012  *RADIOLOGY REPORT*  Clinical Data: Altered mental status.  Question aspiration.  CHEST - 1 VIEW  Comparison: None.  Findings: Heart is normal size.  Linear densities in the lung bases, right greater than left, likely scarring or atelectasis.  No confluent opacities otherwise.  No effusions.  No acute bony abnormality.  IMPRESSION: Bibasilar scarring or atelectasis.   Original Report Authenticated By: Charlett Nose, M.D.    Ct Head Wo Contrast  07/28/2012   *RADIOLOGY REPORT*  Clinical Data: Mental status changes.  CT HEAD WITHOUT CONTRAST  Technique:  Contiguous axial images were obtained from the base of the skull through the vertex without contrast.  Comparison: None.  Findings: There is atrophy and chronic small vessel disease changes. No acute intracranial abnormality.  Specifically, no hemorrhage, hydrocephalus, mass lesion, acute infarction, or significant intracranial injury.  No acute calvarial abnormality.  IMPRESSION: No acute intracranial abnormality.   Original Report Authenticated By: Charlett Nose, M.D.     MEDICATIONS: Scheduled Meds: . acetaminophen  1,000 mg Oral TID  . docusate sodium  100 mg Oral BID  . enoxaparin (LOVENOX) injection  40 mg Subcutaneous q1800  . insulin  aspart  0-5 Units Subcutaneous QHS  . insulin aspart  0-9 Units Subcutaneous TID WC  . methylphenidate  10 mg Oral BID WC  . pantoprazole  40 mg Oral Q1200  . pioglitazone  15 mg Oral Q breakfast  . senna-docusate  1 tablet Oral QHS  . sodium chloride  3 mL Intravenous Q12H  . tamsulosin  0.4 mg Oral Daily   Continuous Infusions: . sodium chloride 150 mL/hr at 07/29/12 0844   PRN Meds:.acetaminophen, acetaminophen, ALPRAZolam, naLOXone (NARCAN)  injection, ondansetron (ZOFRAN) IV, ondansetron, oxyCODONE, senna-docusate, simethicone, tiZANidine  Antibiotics: Anti-infectives   None       Jeoffrey Massed, MD  [facility] Pager:[phone]  If 7PM-7AM, please contact night-coverage [email or web address] Password [facility] 07/30/2012, 10:51 AM   LOS: 2 days

## 2012-07-30 NOTE — Progress Notes (Signed)
Pt. Received discharge instructions and appointment times.  The medications which were brought from home (which initially belonged to his wife) were destroyed in the pharmacy and with full knowledge and consent of the patient.  Prescriptions were explained and given to patient.  Please refer to SW clinical note for recommendations from that department.  Pt. Discharged via private vehicle with friends to his condominium.  Escorted to the exit via wheelchair by nurse tech.

## 2012-07-30 NOTE — Clinical Social Work Psychosocial (Signed)
Clinical Social Work Department BRIEF PSYCHOSOCIAL ASSESSMENT 07/30/2012  Patient:  Shane Casey, Shane Casey     Account Number:  [id number]     Admit date:  07/28/2012  Clinical Social Worker:  Oswaldo Done  Date/Time:  07/30/2012 10:15 AM  Referred by:  RN  Date Referred:  07/29/2012 Referred for  Other - See comment   Other Referral:   Interview type:  Patient Other interview type:    PSYCHOSOCIAL DATA Living Status:  ALONE Admitted from facility:   Level of care:   Primary support name:  Angela Adam Primary support relationship to patient:  FAMILY Degree of support available:   Limited    CURRENT CONCERNS Current Concerns  Knowledge/Cognitive Deficit   Other Concerns:    SOCIAL WORK ASSESSMENT / PLAN CSW introduced self and met patient at bedside. Patient presents with flat affect and slowed speech. Patient states he moved to [place] in 1997-09-27 after loosing job of 38 years in [place]. Moved here to take care of parents. Mother died in Sep 27, 2009. Patient has lived alone since that time. Patient talks to his sister in [place] every 2-3 days. Denies suicidal ideation although acknowledges some depression. Patient not familiar with counseling resources in the area. CSW provided patient with a list of outpatient psychiatrists and counselors he can see for additional support and mild depression.   Assessment/plan status:  Information/Referral to [facility] Other assessment/ plan:   Information/referral to community resources:   Outpatient Mental Health Providers List  Outpatient Psychiatry and Counseling List    PATIENT'S/FAMILY'S RESPONSE TO PLAN OF CARE: Patient thanked CSW for list of providers. No other needs at this time. CSW signing off.        Ricke Hey, [place] [phone] (weekend)

## 2012-07-30 NOTE — Progress Notes (Signed)
Pt refuses to let nurse assess his shoulder wound; pt became combative.

## 2012-08-12 ENCOUNTER — Ambulatory Visit: Payer: Medicare Other | Admitting: Internal Medicine

## 2012-08-23 ENCOUNTER — Ambulatory Visit: Payer: Medicare Other | Admitting: Internal Medicine

## 2012-10-27 ENCOUNTER — Other Ambulatory Visit: Payer: Self-pay | Admitting: Orthopedic Surgery

## 2012-10-27 DIAGNOSIS — M25511 Pain in right shoulder: Secondary | ICD-10-CM

## 2012-11-05 ENCOUNTER — Ambulatory Visit
Admission: RE | Admit: 2012-11-05 | Discharge: 2012-11-05 | Disposition: A | Discharge status: Home / Self Care | Payer: Medicare Other | Source: Ambulatory Visit | Attending: Orthopedic Surgery | Admitting: Orthopedic Surgery

## 2012-11-05 DIAGNOSIS — M25511 Pain in right shoulder: Secondary | ICD-10-CM

## 2012-12-05 ENCOUNTER — Ambulatory Visit: Payer: Medicare Other | Admitting: *Deleted

## 2013-04-19 ENCOUNTER — Ambulatory Visit: Payer: Medicare Other | Admitting: Sports Medicine

## 2013-04-20 ENCOUNTER — Ambulatory Visit (INDEPENDENT_AMBULATORY_CARE_PROVIDER_SITE_OTHER): Payer: Medicare Other | Admitting: Sports Medicine

## 2013-04-20 ENCOUNTER — Encounter: Payer: Self-pay | Admitting: Sports Medicine

## 2013-04-20 VITALS — BP 155/102 | HR 80 | Ht 73.0 in | Wt 210.0 lb

## 2013-04-20 DIAGNOSIS — M7500 Adhesive capsulitis of unspecified shoulder: Secondary | ICD-10-CM

## 2013-04-20 DIAGNOSIS — E119 Type 2 diabetes mellitus without complications: Secondary | ICD-10-CM

## 2013-04-20 DIAGNOSIS — M7501 Adhesive capsulitis of right shoulder: Secondary | ICD-10-CM

## 2013-04-20 NOTE — Assessment & Plan Note (Signed)
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

## 2013-04-20 NOTE — Progress Notes (Signed)
   Subjective:    Patient ID: Shane Casey, male    DOB: Mar 13, 1946, 67 y.o.   MRN: [id number]  HPI  Pt presents to clinic for evaluation of rt shoulder pain.  Had rotator cuff repair 07/27/12 was in sling for 1 month afterwards. Has been doing PT with Shane Casey since 1 month after surgery. Still having pain with certain motions- abduction, elevation, reaching backward.  Shane Casey felt he might have impingement and sent for evaluation by [place] MRI did not show failure of repair  He had a complicated post op period Overdose on Morphine which he says after severe pain led to hospitalization Found to have DM Glycohgb was 14 !!  In past 8 weeks has made a lot of progress with PT Dry needling did not help but harder exercises have improved motion  Has some left shoulder pain  He worked as Artist for many years    Review of Systems     Objective:   Physical Exam Pleasant older male in NAD  Rt shoulder: Positive frozen shoulder sign with abduction and ER 5 deg rotation vs 25 deg rotation on LT  Pure abduction - 60 deg before tight and painful Lacks 20-30 deg elevation Back scratch 1 hand width less than rt Forward flexion is 30 to 40 deg less Unable to get in position to do impingement testing   Strength good ER and IR bilat and with abduction at waist level   MSK [place] There are post op changes The SST still has splits but also has tacks and screw reattachment to humeral head Inf Spin and Teres minor are normal Biceps tendon normal Subscap vis is poor but seems OK AC joint mild DJD  Dynamic motion No impingement and in fact has good RC motion However, Deltoid does not Move      Assessment & Plan:

## 2013-04-20 NOTE — Assessment & Plan Note (Signed)
Return for PT with Shane Casey Needs to continue to work ROM with Codman exercises However, he cannot push this too fast as it is dependent on good DM control  No sign of tear or impingement

## 2013-04-20 NOTE — Patient Instructions (Signed)
Start taking 500 mg of Vitamin C and Vitamin D 800 IU   Continue PT with Amado Coe  Codman exercise series- for frozen shoulder  No impingement seen  Start working with Dr. Andrey Campanile on getting tight control of your diabetes Hgb A1c goal of 7   Dr. Andrey Campanile may want to consider adding low dose amitriptyline, nortriptyline, or gabapentin if frozen shoulder is not resolving  Please follow up in 2 months  Thank you for seeing [place] today!

## 2013-11-29 ENCOUNTER — Encounter: Payer: Self-pay | Admitting: Sports Medicine

## 2013-11-29 ENCOUNTER — Ambulatory Visit (INDEPENDENT_AMBULATORY_CARE_PROVIDER_SITE_OTHER): Payer: Medicare Other | Admitting: Sports Medicine

## 2013-11-29 VITALS — BP 159/94 | Ht 74.0 in | Wt 210.0 lb

## 2013-11-29 DIAGNOSIS — M545 Low back pain, unspecified: Secondary | ICD-10-CM

## 2013-11-29 DIAGNOSIS — M549 Dorsalgia, unspecified: Secondary | ICD-10-CM

## 2013-11-29 MED ORDER — DICLOFENAC SODIUM 75 MG PO TBEC: 75.0000 mg | DELAYED_RELEASE_TABLET | Freq: Two times a day (BID) | ORAL | Status: DC

## 2013-11-29 NOTE — Assessment & Plan Note (Signed)
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

## 2013-11-29 NOTE — Patient Instructions (Addendum)
It was great seeing you today.   1. I will get an Xray of your back and call you with any abnormal results 2. Take Voltaren twice a day as needed; ~ 2 hours prior to driving  3. Do back exercises as instructed; I have referred you to physical therapy for additional exercise rehab  Next Appointment  Please call to make an appointment with Dr Margaretha Sheffieldraper in 3-4 weeks  Take Care,   Dr Wenda LowJames Dmani Mizer

## 2013-11-29 NOTE — Progress Notes (Signed)
  Geronimo RunningRichard Biss - 68 y.o. male MRN [id number]  Date of birth: 05/08/1946   SUBJECTIVE:     Shane Casey comes in today complaining of low back pain for approximately 2 months.  He denies any trauma or injury that he can recall.  He describes the pain as "burning" without numbness, or lower extremity weakness.  There is some radiation down his right hip, as well as some cramping, spasm of his hamstrings.  He only noticed the pain when he is driving in a car; the pain resolves when he gets out of the car and walks around.  He has tried placing a pillow in his lumbar area, but has not noticed any relief with this.  He does continue to go to the gym daily, as well as does rowing and kayaking without any aggravation of his back pain.  He denies any bowel or bladder incontinence or retention.  Denies any fevers, chills, weight loss, or night sweats.   ROS:     See HPI  PERTINENT  PMH / PSH FH / / SH:  Past Medical, Surgical, Social, and Family History Reviewed & Updated in the EMR.  Pertinent findings include:   Post polio syndrome  Scoliosis  OBJECTIVE: BP 159/94  Ht 6\' 2"  (1.88 m)  Wt 210 lb (95.255 kg)  BMI 26.95 kg/m2  Physical Exam:  Vital signs are reviewed.  Back Exam: Inspection: Normal alignment without overlying erythema Motion: Full lumbar flexion and extension SLR and XSLR both seated & lying: Negative No Palpable tenderness FABER: Negative bilaterally Sensory: Intact Reflex: 2+ patellar and Achilles bilaterally Strength: 5/5 bilaterally for knee extension, flexion, and ankle dorsiflexion, plantarflexion  ASSESSMENT & PLAN:  See problem based charting & AVS for pt instructions.

## 2013-12-01 ENCOUNTER — Ambulatory Visit
Admission: RE | Admit: 2013-12-01 | Discharge: 2013-12-01 | Disposition: A | Discharge status: Home / Self Care | Payer: Medicare Other | Source: Ambulatory Visit | Attending: Sports Medicine | Admitting: Sports Medicine

## 2013-12-01 DIAGNOSIS — M545 Low back pain: Secondary | ICD-10-CM

## 2013-12-11 ENCOUNTER — Telehealth: Payer: Self-pay | Admitting: *Deleted

## 2013-12-11 NOTE — Telephone Encounter (Signed)
Message copied by Linward HeadlandSTRAUGHN, Marymargaret Kirker N on Mon Dec 11, 2013  3:32 PM ------      Message from: CERESI, MELANIE L      Created: Mon Dec 11, 2013 11:52 AM      Regarding: back xray       Pt left message asking for his back xray results. Also asked if they could be faxed to Dr. Joanne GavelSutton office at [phone]. I printed and will fax. ------

## 2013-12-27 ENCOUNTER — Ambulatory Visit: Payer: Medicare Other | Admitting: Sports Medicine

## 2014-01-03 ENCOUNTER — Ambulatory Visit (INDEPENDENT_AMBULATORY_CARE_PROVIDER_SITE_OTHER): Payer: Medicare Other | Admitting: Sports Medicine

## 2014-01-03 ENCOUNTER — Encounter: Payer: Self-pay | Admitting: Sports Medicine

## 2014-01-03 VITALS — BP 154/83 | Ht 74.0 in | Wt 220.0 lb

## 2014-01-03 DIAGNOSIS — M25559 Pain in unspecified hip: Secondary | ICD-10-CM

## 2014-01-03 DIAGNOSIS — M25551 Pain in right hip: Secondary | ICD-10-CM

## 2014-01-04 NOTE — Progress Notes (Signed)
   Subjective:    Patient ID: Shane Casey, male    DOB: 07/12/1945, 68 y.o.   MRN: [id number]  HPI Patient comes in today for followup on low back pain and posterior right hip pain. Low back pain has completely resolved. Unfortunately, he still having some pain in the posterior right hip. It is present only with prolonged sitting such as with driving. He localizes his pain to the ischial tuberosity. No associated numbness or tingling. No groin pain. No pain with standing. He did not take his diclofenac out of concerns for cardiovascular side effects. [facility]. He is concerned that he will be uncomfortable during his journey.    Review of Systems     Objective:   Physical Exam Well-developed, no acute distress  Full painless lumbar range of motion. No tenderness to palpation along the lumbar midline. No spasm. There is tenderness to palpation at the right ischial tuberosity at the origin of the common hamstring tendon. No palpable defect. Reproducible pain with hamstring stretching. Walking without a limp.       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 I will fax over a new order to Onalee HuaDavid and he can discuss modalities and treatments including dry needling with Demonie. Patient will followup with me in 3-4 weeks. We discussed the possibility of a cortisone injection if symptoms persist. He is very skeptical about taking the Voltaren due to potential cardiovascular side effects.

## 2014-02-05 ENCOUNTER — Ambulatory Visit: Payer: Medicare Other | Admitting: Sports Medicine

## 2014-02-06 ENCOUNTER — Ambulatory Visit (INDEPENDENT_AMBULATORY_CARE_PROVIDER_SITE_OTHER): Payer: Medicare Other | Admitting: Sports Medicine

## 2014-02-06 ENCOUNTER — Encounter: Payer: Self-pay | Admitting: Sports Medicine

## 2014-02-06 VITALS — BP 121/79 | HR 71 | Ht 74.0 in | Wt 220.0 lb

## 2014-02-06 DIAGNOSIS — M25551 Pain in right hip: Secondary | ICD-10-CM

## 2014-02-06 DIAGNOSIS — M25559 Pain in unspecified hip: Secondary | ICD-10-CM

## 2014-02-06 NOTE — Progress Notes (Signed)
   Subjective:    Patient ID: Shane Casey, male    DOB: 1945/07/05, 68 y.o.   MRN: [id number]  HPI Patient comes in today for followup on posterior right hip pain. He has been working with Amado Coe. He feels like he is improving but is not pain-free. Still getting some discomfort with long car rides. Still localizing his pain to the area of the ischial tuberosity. Again, he feels like overall he is making good progress with physical therapy. Still denies groin pain.   Review of Systems     Objective:   Physical Exam Sitting comfortable in exam room. No acute distress  He is tender to palpation over both the right and left ischial tuberosities. No palpable defect. Smooth painless hip range of motion bilaterally. Walking without a limp.       Assessment & Plan:  Improving posterior right hip pain secondary to proximal hamstring tendinopathy versus ischial tuberosity bursitis  Patient's symptoms are improving with physical therapy. He'll continue working with Amado Coe. We discussed the possibility of cortisone injection if symptoms persist or worsen. Followup for ongoing or calcification issues.

## 2014-05-09 ENCOUNTER — Emergency Department (HOSPITAL_COMMUNITY): Payer: Medicare Other

## 2014-05-09 ENCOUNTER — Emergency Department (HOSPITAL_COMMUNITY)
Admission: EM | Admit: 2014-05-09 | Discharge: 2014-05-09 | Disposition: A | Discharge status: Home / Self Care | Payer: Medicare Other | Attending: Emergency Medicine | Admitting: Emergency Medicine

## 2014-05-09 ENCOUNTER — Encounter (HOSPITAL_COMMUNITY): Payer: Self-pay

## 2014-05-09 DIAGNOSIS — J189 Pneumonia, unspecified organism: Secondary | ICD-10-CM

## 2014-05-09 DIAGNOSIS — J159 Unspecified bacterial pneumonia: Secondary | ICD-10-CM | POA: Insufficient documentation

## 2014-05-09 DIAGNOSIS — Z791 Long term (current) use of non-steroidal anti-inflammatories (NSAID): Secondary | ICD-10-CM | POA: Diagnosis not present

## 2014-05-09 DIAGNOSIS — Z87891 Personal history of nicotine dependence: Secondary | ICD-10-CM | POA: Diagnosis not present

## 2014-05-09 DIAGNOSIS — Z794 Long term (current) use of insulin: Secondary | ICD-10-CM | POA: Insufficient documentation

## 2014-05-09 DIAGNOSIS — Z8719 Personal history of other diseases of the digestive system: Secondary | ICD-10-CM | POA: Insufficient documentation

## 2014-05-09 DIAGNOSIS — J9801 Acute bronchospasm: Secondary | ICD-10-CM | POA: Diagnosis not present

## 2014-05-09 DIAGNOSIS — R05 Cough: Secondary | ICD-10-CM | POA: Diagnosis present

## 2014-05-09 DIAGNOSIS — Z79899 Other long term (current) drug therapy: Secondary | ICD-10-CM | POA: Diagnosis not present

## 2014-05-09 DIAGNOSIS — F909 Attention-deficit hyperactivity disorder, unspecified type: Secondary | ICD-10-CM | POA: Diagnosis not present

## 2014-05-09 DIAGNOSIS — F329 Major depressive disorder, single episode, unspecified: Secondary | ICD-10-CM | POA: Insufficient documentation

## 2014-05-09 DIAGNOSIS — F419 Anxiety disorder, unspecified: Secondary | ICD-10-CM | POA: Insufficient documentation

## 2014-05-09 DIAGNOSIS — E119 Type 2 diabetes mellitus without complications: Secondary | ICD-10-CM | POA: Diagnosis not present

## 2014-05-09 DIAGNOSIS — R059 Cough, unspecified: Secondary | ICD-10-CM

## 2014-05-09 LAB — CBC WITH DIFFERENTIAL/PLATELET
BASOS ABS: 0.1 10*3/uL (ref 0.0–0.1)
BASOS PCT: 1 % (ref 0–1)
EOS ABS: 0.4 10*3/uL (ref 0.0–0.7)
EOS PCT: 4 % (ref 0–5)
HCT: 50.4 % (ref 39.0–52.0)
Hemoglobin: 16.7 g/dL (ref 13.0–17.0)
LYMPHS ABS: 2.8 10*3/uL (ref 0.7–4.0)
Lymphocytes Relative: 32 % (ref 12–46)
MCH: 30.6 pg (ref 26.0–34.0)
MCHC: 33.1 g/dL (ref 30.0–36.0)
MCV: 92.3 fL (ref 78.0–100.0)
Monocytes Absolute: 1 10*3/uL (ref 0.1–1.0)
Monocytes Relative: 12 % (ref 3–12)
NEUTROS PCT: 51 % (ref 43–77)
Neutro Abs: 4.4 10*3/uL (ref 1.7–7.7)
PLATELETS: 140 10*3/uL — AB (ref 150–400)
RBC: 5.46 MIL/uL (ref 4.22–5.81)
RDW: 13.1 % (ref 11.5–15.5)
WBC: 8.6 10*3/uL (ref 4.0–10.5)

## 2014-05-09 LAB — COMPREHENSIVE METABOLIC PANEL
ALBUMIN: 3.9 g/dL (ref 3.5–5.2)
ALT: 50 U/L (ref 0–53)
AST: 30 U/L (ref 0–37)
Alkaline Phosphatase: 61 U/L (ref 39–117)
Anion gap: 5 (ref 5–15)
BUN: 18 mg/dL (ref 6–23)
CALCIUM: 9.4 mg/dL (ref 8.4–10.5)
CO2: 30 mmol/L (ref 19–32)
Chloride: 103 mEq/L (ref 96–112)
Creatinine, Ser: 1.34 mg/dL (ref 0.50–1.35)
GFR calc non Af Amer: 54 mL/min — ABNORMAL LOW (ref >90)
GFR, EST AFRICAN AMERICAN: 62 mL/min — AB (ref >90)
GLUCOSE: 139 mg/dL — AB (ref 70–99)
Potassium: 4.4 mmol/L (ref 3.5–5.1)
SODIUM: 138 mmol/L (ref 135–145)
TOTAL PROTEIN: 7.1 g/dL (ref 6.0–8.3)
Total Bilirubin: 0.9 mg/dL (ref 0.3–1.2)

## 2014-05-09 LAB — TROPONIN I: Troponin I: <0.03 ng/mL (ref <0.031)

## 2014-05-09 LAB — BRAIN NATRIURETIC PEPTIDE: B NATRIURETIC PEPTIDE 5: 15.5 pg/mL (ref 0.0–100.0)

## 2014-05-09 MED ORDER — ALBUTEROL SULFATE (2.5 MG/3ML) 0.083% IN NEBU
5.0000 mg | INHALATION_SOLUTION | Freq: Once | RESPIRATORY_TRACT | Status: AC
  Administered 2014-05-09: 5 mg via RESPIRATORY_TRACT
  Filled 2014-05-09: qty 6

## 2014-05-09 MED ORDER — LEVOFLOXACIN 500 MG PO TABS
500.0000 mg | ORAL_TABLET | Freq: Once | ORAL | Status: AC
  Administered 2014-05-09: 500 mg via ORAL
  Filled 2014-05-09: qty 1

## 2014-05-09 MED ORDER — LEVOFLOXACIN 500 MG PO TABS: 500.0000 mg | ORAL_TABLET | Freq: Every day | ORAL | Status: DC

## 2014-05-09 MED ORDER — ALBUTEROL SULFATE HFA 108 (90 BASE) MCG/ACT IN AERS
2.0000 | INHALATION_SPRAY | RESPIRATORY_TRACT | Status: DC
  Filled 2014-05-09: qty 6.7

## 2014-05-09 NOTE — ED Notes (Signed)
Inhaler given to patient upon d/c.

## 2014-05-09 NOTE — ED Notes (Signed)
Pt presents with c/o shortness of breath and cough that started approx 3 days ago. Pt reports that he thought he was getting a cold but he has not been able to stop coughing, worse when laying down. Ambulatory to triage. Pt has a hx of pneumonia.

## 2014-05-09 NOTE — ED Provider Notes (Signed)
CSN: [id number]     Arrival date & time 05/09/14  [id number] History   First MD Initiated Contact with Patient 05/09/14 0347     Chief Complaint  Patient presents with  . Shortness of Breath  . Cough      HPI Patient reports productive cough over the past 3 days as well some new developing shortness of breath today.  Denies orthopnea.  Does report some postnasal drip when he lays flat that results in coughing.  No history of congestive heart failure.  Denies unilateral leg swelling.  No edema.  No fevers.  No recent long travel or surgery.  Patient's concern is the possibility of pneumonia as his had symptoms like this before.  He reports nasal congestion and change in his voice.  Symptoms are mild.  Past Medical History  Diagnosis Date  . ADD (attention deficit disorder) without hyperactivity   . Depression   . Anxiety   . DM type 2 (diabetes mellitus, type 2)   . GERD (gastroesophageal reflux disease)   . IBS (irritable bowel syndrome)    History reviewed. No pertinent past surgical history. Family History  Problem Relation Age of Onset  . Adopted: Yes   History  Substance Use Topics  . Smoking status: Former Smoker -- 1.00 packs/day for 25 years    Types: Cigarettes  . Smokeless tobacco: Not on file  . Alcohol Use: No    Review of Systems  All other systems reviewed and are negative.     Allergies  Metformin and related  Home Medications   Prior to Admission medications   Medication Sig Start Date End Date Taking? Authorizing Provider  ALPRAZolam (XANAX) 0.25 MG tablet Take 0.25 mg by mouth as needed. 03/06/13   Historical Provider, MD  atorvastatin (LIPITOR) 10 MG tablet  10/28/13   Historical Provider, MD  diclofenac (VOLTAREN) 75 MG EC tablet Take 1 tablet (75 mg total) by mouth 2 (two) times daily. 11/29/13   Jamal CollinJames R Joyner, MD  HUMALOG MIX 75/25 KWIKPEN (75-25) 100 UNIT/ML Stephanie CoupKwikpen  01/26/14   Historical Provider, MD  ibuprofen (MOTRIN IB) 200 MG tablet Take 4  tablets (800 mg total) by mouth every 6 (six) hours as needed for pain. 07/30/12   Shanker Levora DredgeM Ghimire, MD  LANTUS SOLOSTAR 100 UNIT/ML SOPN Inject 30 Units into the skin daily.  04/03/13   Historical Provider, MD  lisinopril (PRINIVIL,ZESTRIL) 10 MG tablet  01/05/14   Historical Provider, MD  methylphenidate (RITALIN) 10 MG tablet Take 1 tablet (10 mg total) by mouth 2 (two) times daily with breakfast and lunch. 07/30/12   Shanker Levora DredgeM Ghimire, MD  oxyCODONE (OXY IR/ROXICODONE) 5 MG immediate release tablet Take 1 tablet (5 mg total) by mouth every 6 (six) hours as needed. 07/30/12   Shanker Levora DredgeM Ghimire, MD  tiZANidine (ZANAFLEX) 4 MG tablet Take 4 mg by mouth at bedtime.    Historical Provider, MD   BP 143/72 mmHg  Pulse 72  Temp(Src) 97.7 F (36.5 C) (Oral)  Resp 20  SpO2 95% Physical Exam  Constitutional: He is oriented to person, place, and time. He appears well-developed and well-nourished.  HENT:  Head: Normocephalic and atraumatic.  Eyes: EOM are normal.  Neck: Normal range of motion.  Cardiovascular: Normal rate, regular rhythm, normal heart sounds and intact distal pulses.   Pulmonary/Chest: Effort normal and breath sounds normal. No respiratory distress.  Abdominal: Soft. He exhibits no distension. There is no tenderness.  Musculoskeletal: Normal range of motion.  Neurological: He is alert and oriented to person, place, and time.  Skin: Skin is warm and dry.  Psychiatric: He has a normal mood and affect. Judgment normal.  Nursing note and vitals reviewed.   ED Course  Procedures (including critical care time) Labs Review Labs Reviewed  CBC WITH DIFFERENTIAL - Abnormal; Notable for the following:    Platelets 140 (*)    All other components within normal limits  COMPREHENSIVE METABOLIC PANEL - Abnormal; Notable for the following:    Glucose, Bld 139 (*)    GFR calc non Af Amer 54 (*)    GFR calc Af Amer 62 (*)    All other components within normal limits  TROPONIN I  BRAIN  NATRIURETIC PEPTIDE    Imaging Review Dg Chest 2 View  05/09/2014   CLINICAL DATA:  Acute onset of shortness of breath and cough, starting 3 days ago. Initial encounter.  EXAM: CHEST  2 VIEW  COMPARISON:  Chest radiograph from 07/28/2012  FINDINGS: The lungs are well-aerated. Mild nonspecific right perihilar haziness is noted. There is no evidence of pleural effusion or pneumothorax.  The heart is normal in size; the mediastinal contour is within normal limits. No acute osseous abnormalities are seen.  IMPRESSION: Mild nonspecific right-sided perihilar haziness. This could reflect mild pneumonia, depending on the patient's symptoms.   Electronically Signed   By: Roanna RaiderJeffery  Chang M.D.   On: 05/09/2014 04:48     EKG Interpretation   Date/Time:  Wednesday May 09 2014 03:52:24 EST Ventricular Rate:  76 PR Interval:    QRS Duration: 99 QT Interval:  391 QTC Calculation: 440 R Axis:   -86 Text Interpretation:  Normal sinus rhythm Paired ventricular premature  complexes Left anterior fascicular block Abnormal R-wave progression, late  transition Artifact in lead(s) I II III aVR aVL aVF V3 V4 V5 V6 No old  tracing to compare Confirmed by Janalyn Higby  MD, Caryn BeeKEVIN ([id number]) on 05/09/2014  3:56:08 AM      MDM   Final diagnoses:  Cough  CAP (community acquired pneumonia)  Bronchospasm    Patient feels better at this time after albuterol.  Questionable developing pneumonia.  Patient be started on Levaquin.  Primary care follow-up.  He understands to return to the ER for new or worsening symptoms.    Lyanne CoKevin M Kyre Jeffries, MD 05/09/14 [phone]

## 2014-11-10 IMAGING — CR DG LUMBAR SPINE 2-3V
3 series · 3 of 3 positions shown · non-contrast
Comparison: None.

CLINICAL DATA: Low back pain.

EXAM:
LUMBAR SPINE - 2-3 VIEW

[view not recorded (1 of 3)]
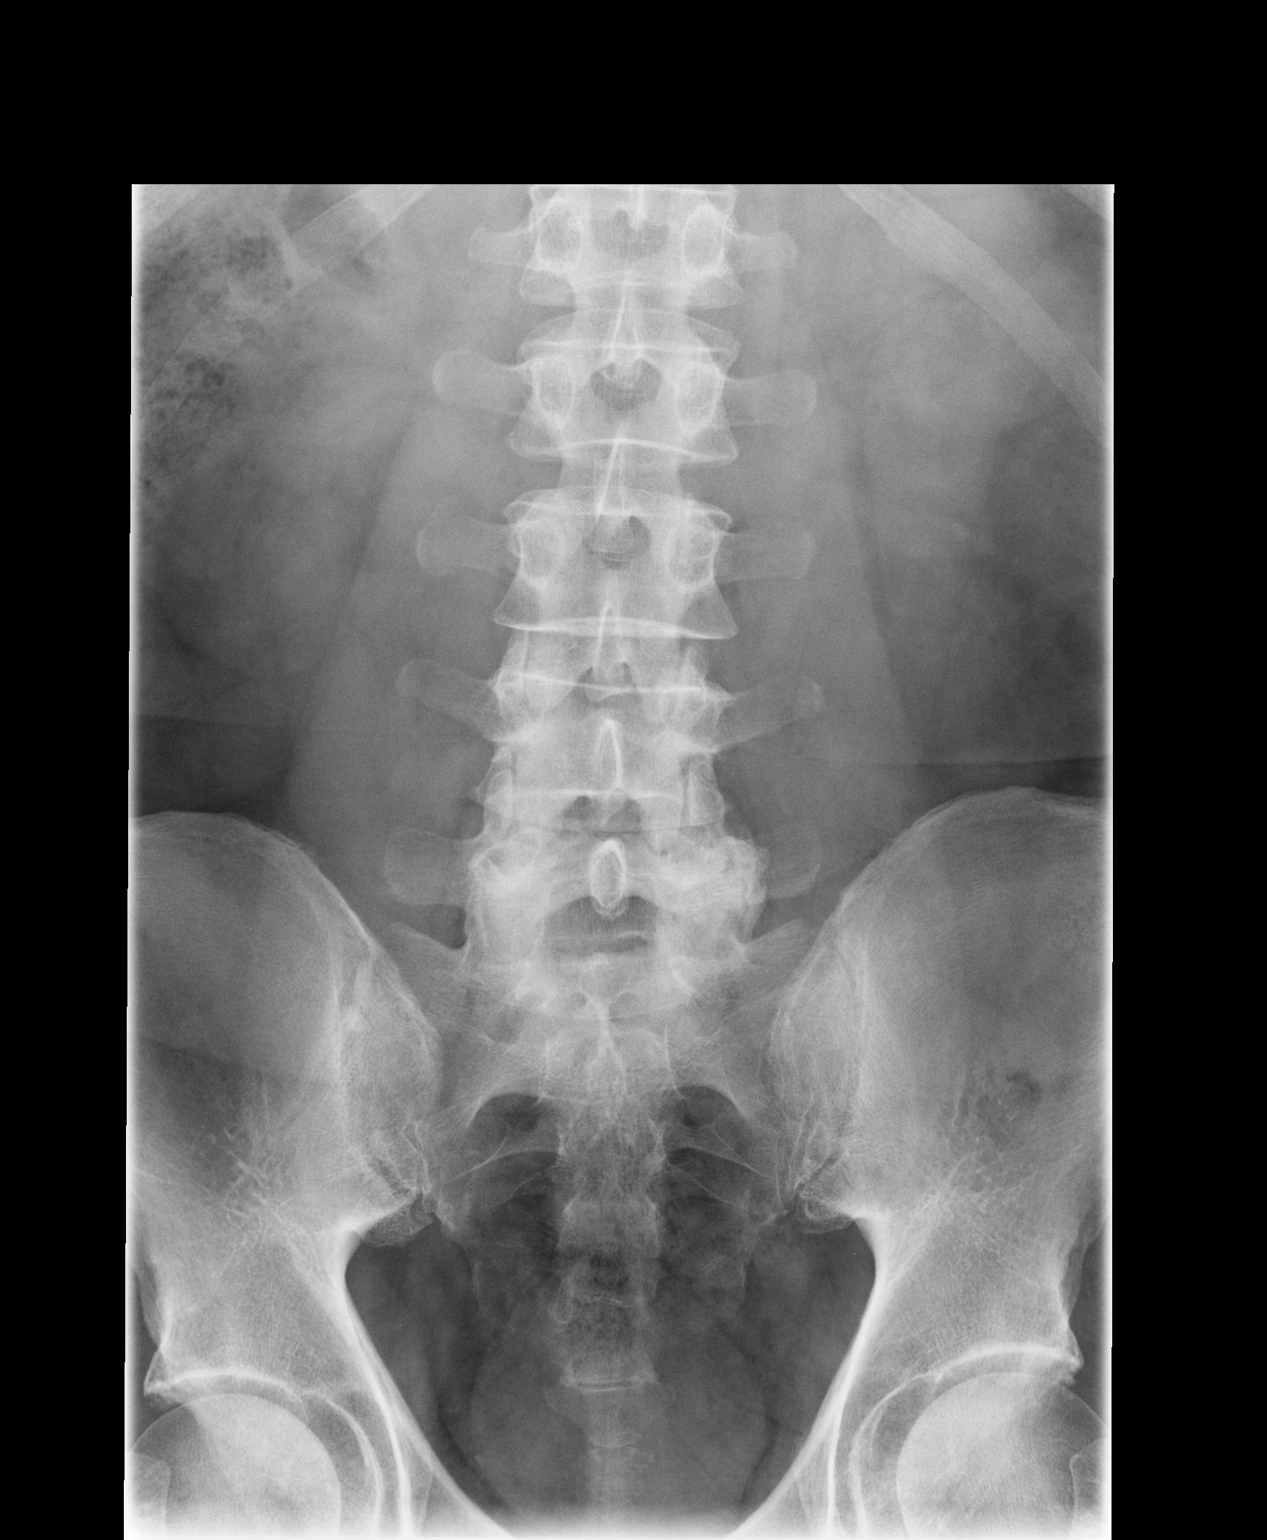

[view not recorded (2 of 3)]
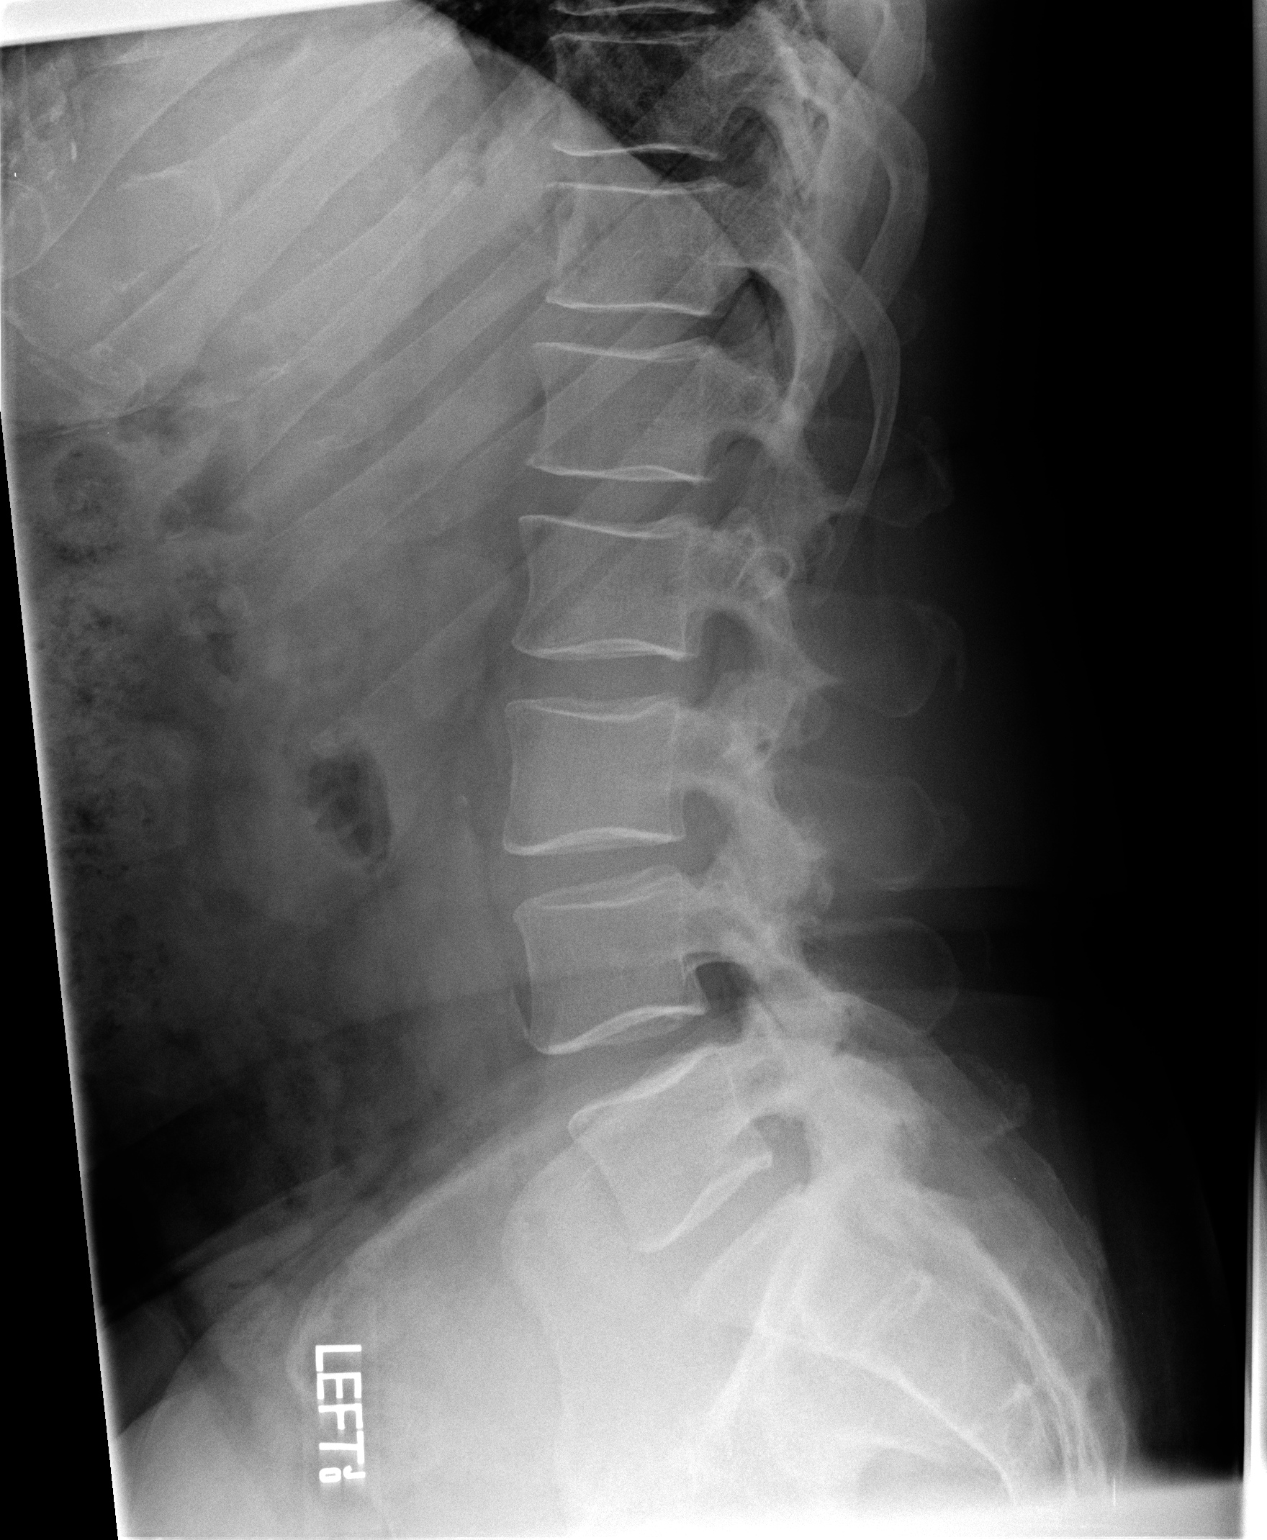

[view not recorded (3 of 3)]
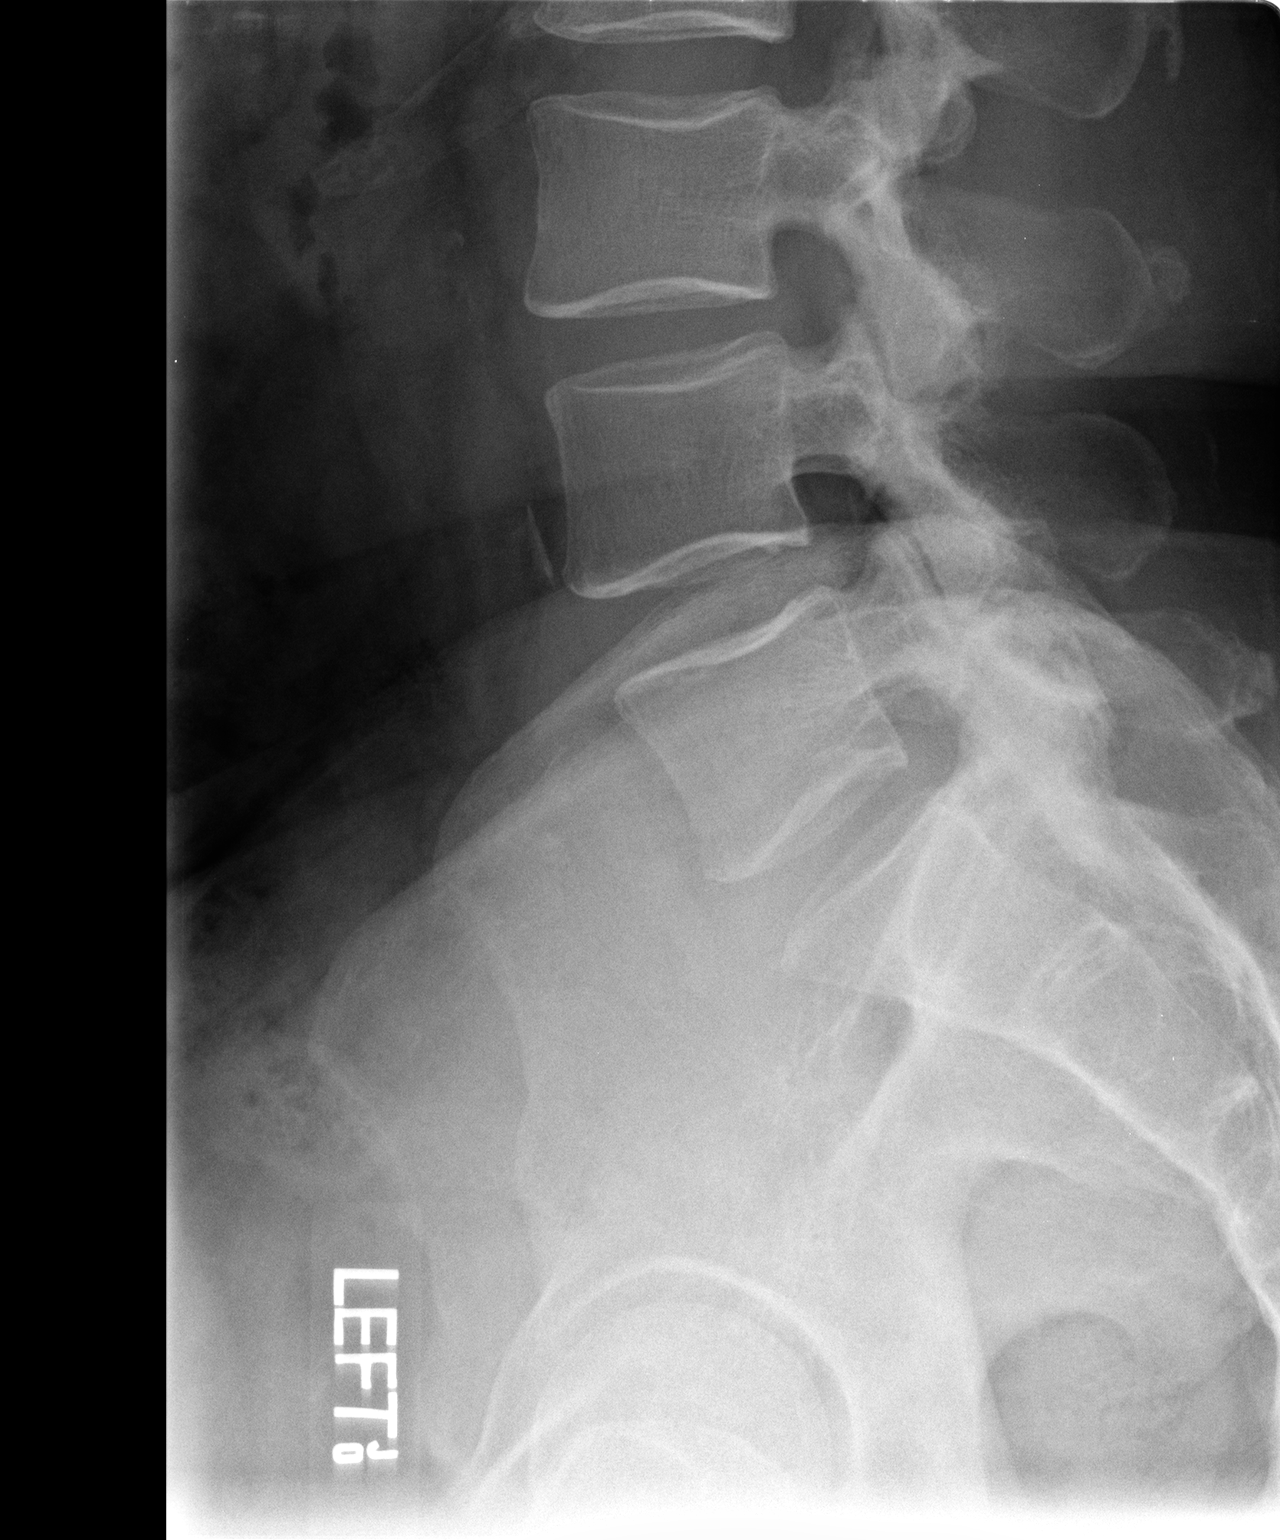

[3 of 3 positions shown; findings below may reference images not displayed]

FINDINGS: Paraspinal soft tissues are unremarkable. No focal bony abnormality
identified. No acute bony abnormality. Pedicles are intact.
IMPRESSION: No acute or focal abnormality.

## 2015-04-29 ENCOUNTER — Ambulatory Visit (INDEPENDENT_AMBULATORY_CARE_PROVIDER_SITE_OTHER): Payer: Medicare Other | Admitting: Sports Medicine

## 2015-04-29 ENCOUNTER — Encounter: Payer: Self-pay | Admitting: Sports Medicine

## 2015-04-29 VITALS — BP 162/84 | Ht 74.0 in | Wt 216.0 lb

## 2015-04-29 DIAGNOSIS — M65319 Trigger thumb, unspecified thumb: Secondary | ICD-10-CM

## 2015-04-29 NOTE — Patient Instructions (Signed)
Please use Aspercreme and taping for one week. Follow up in one week if no improvement for possible corticosteroid shot. May cancel appointment if improved.  Trigger Finger Trigger finger (digital tendinitis and stenosing tenosynovitis) is a common disorder that causes an often painful catching of the fingers or thumb. It occurs as a clicking, snapping, or locking of a finger in the palm of the hand. This is caused by a problem with the tendons that flex or bend the fingers sliding smoothly through their sheaths. The condition may occur in any finger or a couple fingers at the same time.  The finger may lock with the finger curled or suddenly straighten out with a snap. This is more common in patients with rheumatoid arthritis and diabetes. Left untreated, the condition may get worse to the point where the finger becomes locked in flexion, like making a fist, or less commonly locked with the finger straightened out. CAUSES   Inflammation and scarring that lead to swelling around the tendon sheath.  Repeated or forceful movements.  Rheumatoid arthritis, an autoimmune disease that affects joints.  Gout.  Diabetes mellitus. SIGNS AND SYMPTOMS  Soreness and swelling of your finger.  A painful clicking or snapping as you bend and straighten your finger. DIAGNOSIS  Your health care provider will do a physical exam of your finger to diagnose trigger finger. TREATMENT   Splinting for 6-8 weeks may be helpful.  Nonsteroidal anti-inflammatory medicines (NSAIDs) can help to relieve the pain and inflammation.  Cortisone injections, along with splinting, may speed up recovery. Several injections may be required. Cortisone may give relief after one injection.  Surgery is another treatment that may be used if conservative treatments do not work. Surgery can be minor, without incisions (a cut does not have to be made), and can be done with a needle through the skin.  Other surgical choices involve  an open procedure in which the surgeon opens the hand through a small incision and cuts the pulley so the tendon can again slide smoothly. Your hand will still work fine. HOME CARE INSTRUCTIONS  Apply ice to the injured area, twice per day:  Put ice in a plastic bag.  Place a towel between your skin and the bag.  Leave the ice on for 20 minutes, 3-4 times a day.  Rest your hand often. MAKE SURE YOU:   Understand these instructions.  Will watch your condition.  Will get help right away if you are not doing well or get worse.   This information is not intended to replace advice given to you by your health care provider. Make sure you discuss any questions you have with your health care provider.   Document Released: 02/22/2004 Document Revised: 01/04/2013 Document Reviewed: 10/04/2012 Elsevier Interactive Patient Education [employer]2016 [employer] Inc.

## 2015-04-29 NOTE — Progress Notes (Signed)
   Subjective:    Patient ID: Geronimo Runningichard Jewell, male    DOB: 01/09/1946, 69 y.o.   MRN: [id number]  HPI Mr. Thad RangerReynolds is a 69yo male presenting for right thumb pain. - First noted last Wednesday (04/24/15). States he went to Pilates for the first time and then the next morning he woke up and noted right thumb pain. Does not recall history of trauma. - Also initially noted pain at first MCP joint of right hand but this has resolved. Now only notes right IP pain.  - Pain with flexion of right thumb - States joint seems to pop out every time he moves it - Did not note any swelling - Has not taken any medications or tried any other treatment modalities -Notes that symptoms do appear to be improving  Review of Systems Per HPI     Objective:   Physical Exam General: 69yo male resting comfortably in no apparent distress Cardiac: upper extremities warm and well perfused Resp: no increased work of breathing noted Musc: First digits of hands symmetrical without any signs of edema, tender to palpation over dorsal IP joint and over nodule palpated over flexor pollicis longus, no tenderness noted over CMC or MCP joints, triggering noted with flexion of IP joint. Flexor and extensor tendons intact. Skin: No bruising noted       Assessment & Plan:  # Trigger Finger of Right IP Joint: Suspected to be secondary to overuse irritating the tendon and resulting in trigger finger. Hopeful that course has been somewhat short, first noted on 04/24/15. Note unable to tolerate NSAIDs due to GI irritation. History of diabetes noted, increasing risk.  Plan: - Aspercreme as needed for pain. Oral NSAIDs contraindicated. - Taping of right IP joint to limit flexion for one week - Activity modification. Cautioned against weight lifting as this may further irritate condition. - Follow up in one week for possible corticosteroid injection if symptoms do not continue to improve.. May cancel appointment if improvement  noted.

## 2015-05-06 ENCOUNTER — Ambulatory Visit: Payer: Medicare Other | Admitting: Sports Medicine

## 2015-06-11 LAB — HEMOGLOBIN A1C: HEMOGLOBIN A1C: 8.1

## 2015-06-11 LAB — BASIC METABOLIC PANEL
BUN: 18 mg/dL (ref 4–21)
Creatinine: 1.4 mg/dL — AB (ref 0.6–1.3)
GLUCOSE: 175 mg/dL
POTASSIUM: 4.9 mmol/L (ref 3.4–5.3)
SODIUM: 141 mmol/L (ref 137–147)

## 2015-07-31 ENCOUNTER — Ambulatory Visit (INDEPENDENT_AMBULATORY_CARE_PROVIDER_SITE_OTHER): Payer: Medicare Other | Admitting: Cardiovascular Disease

## 2015-07-31 VITALS — BP 146/80 | HR 74 | Ht 74.0 in | Wt 225.1 lb

## 2015-07-31 DIAGNOSIS — Z7189 Other specified counseling: Secondary | ICD-10-CM

## 2015-07-31 DIAGNOSIS — Z7689 Persons encountering health services in other specified circumstances: Secondary | ICD-10-CM

## 2015-07-31 NOTE — Patient Instructions (Signed)
Medication Instructions:  Your physician recommends that you continue on your current medications as directed. Please refer to the Current Medication list given to you today.  Labwork: NONE  Testing/Procedures: NONE  Follow-Up: Your physician wants you to follow-up as needed.  If you need a refill on your cardiac medications before your next appointment, please call your pharmacy.   

## 2015-07-31 NOTE — Progress Notes (Signed)
Patient ID: Shane Casey, male   DOB: 12/22/1945, 70 y.o.   MRN: [id number]     Cardiology Office Note   Date:  07/31/2015   ID:  Shane Casey, DOB 02/07/1946, MRN [id number]  PCP:  Pamelia Hoit, MD  Cardiologist:   Charlton Haws, MD   Chief Complaint  Patient presents with  . Establish Care    Possible problems with BP, per pt      History of Present Illness: Shane Casey is a 70 y.o. male who presents for evaluation of BP Has chronic anxiety , OCD, DM.  Reviewed office note from Dr Doristine Counter, 05/27/15.  Indicated home readings from patient a bit elevated and patient was to continue lisinopril and added amlodipine 5 mg every night Labs reviewed and renal function Cr 1.4 K 4.9  A1C 8.1  No chest pain Has some anxiety about his health.  No chest pain Concerned about having a stroke.  Compliant with meds  Originally from [place] Adopted moved here to take care of mother Who died in Sep 17, 2009     Past Medical History  Diagnosis Date  . ADD (attention deficit disorder) without hyperactivity   . Depression   . Anxiety   . DM type 2 (diabetes mellitus, type 2) (HCC)   . GERD (gastroesophageal reflux disease)   . IBS (irritable bowel syndrome)     Past Surgical History  Procedure Laterality Date  . Shoulder surgery  2014    ROTATOR CUFF REPAIR  . Appendectomy    . Knee surgery      X 3  . Colonscopy      X2     Current Outpatient Prescriptions  Medication Sig Dispense Refill  . ALPRAZolam (XANAX) 0.25 MG tablet Take 0.125 mg by mouth at bedtime as needed. For sleep  2  . amLODipine (NORVASC) 5 MG tablet Take 5 mg by mouth daily.  1  . amLODipine (NORVASC) 5 MG tablet Take 5 mg by mouth daily.    . fluocinonide gel (LIDEX) 0.05 % Apply 1 application topically daily as needed (for rash).    Marland Kitchen HUMALOG MIX 75/25 KWIKPEN (75-25) 100 UNIT/ML Kwikpen Inject 10 Units into the skin every morning.     . hydrochlorothiazide (MICROZIDE) 12.5 MG capsule Take 12.5 mg by mouth daily.      . hydrochlorothiazide (MICROZIDE) 12.5 MG capsule Take 12.5 mg by mouth daily.    . insulin glargine (LANTUS) 100 UNIT/ML injection Inject 35 Units into the skin daily.    Marland Kitchen lisinopril (PRINIVIL,ZESTRIL) 10 MG tablet Take 20 mg by mouth daily.     Marland Kitchen lisinopril (PRINIVIL,ZESTRIL) 20 MG tablet Take 20 mg by mouth daily.  3  . methylphenidate (RITALIN) 10 MG tablet Take 1 tablet (10 mg total) by mouth 2 (two) times daily with breakfast and lunch.    . methylphenidate (RITALIN) 10 MG tablet Take 10 mg by mouth daily as needed (for ADHD).    Marland Kitchen tiZANidine (ZANAFLEX) 4 MG tablet Take 2 mg by mouth every 6 (six) hours as needed for muscle spasms.     No current facility-administered medications for this visit.    Allergies:   Lipitor and Metformin and related    Social History:  The patient  reports that he has quit smoking. His smoking use included Cigarettes. He has a 25 pack-year smoking history. He does not have any smokeless tobacco history on file. He reports that he does not drink alcohol or use illicit drugs.   Family History:  The patient's family history is not on file. He was adopted.    ROS:  Please see the history of present illness.   Otherwise, review of systems are positive for none.   All other systems are reviewed and negative.    PHYSICAL EXAM: VS:  BP 146/80 mmHg  Pulse 74  Ht 6\' 2"  (1.88 m)  Wt 102.114 kg (225 lb 1.9 oz)  BMI 28.89 kg/m2 , BMI Body mass index is 28.89 kg/(m^2). Affect appropriate Healthy:  appears stated age HEENT: normal Neck supple with no adenopathy JVP normal no bruits no thyromegaly Lungs clear with no wheezing and good diaphragmatic motion Heart:  S1/S2 no murmur, no rub, gallop or click PMI normal Abdomen: benighn, BS positve, no tenderness, no AAA no bruit.  No HSM or HJR Distal pulses intact with no bruits No edema Neuro non-focal Skin warm and dry No muscular weakness    EKG:  SR normal 07/31/15   Recent Labs: No results found  for requested labs within last 365 days.    Lipid Panel No results found for: CHOL, TRIG, HDL, CHOLHDL, VLDL, LDLCALC, LDLDIRECT    Wt Readings from Last 3 Encounters:  07/31/15 102.114 kg (225 lb 1.9 oz)  04/29/15 97.977 kg (216 lb)  02/06/14 99.791 kg (220 lb)      Other studies Reviewed: Additional studies/ records that were reviewed today include:  Primary care notes see HPI.    ASSESSMENT AND PLAN:  1.  HTN  Well controlled.  Continue current medications and low sodium Dash type diet.   2. DM:  Discussed low carb diet.  Target hemoglobin A1c is 6.5 or less.  Continue current medications. 3. Chol:  Intolerant to statins diet Rx  F/u primary 4. ADD:  Odd affect on ritalin consider non stimulant like stratterra given BP   Current medicines are reviewed at length with the patient today.  The patient does not have concerns regarding medicines.  The following changes have been made:  no change  Labs/ tests ordered today include: None  No orders of the defined types were placed in this encounter.     Disposition:   FU with us PRN     Signed, Charlton HawsPeter Nishan, MD  07/31/2015 3:37 PM    [facility] [address], [place], [place]  [address] Phone: [phone]; Fax: [phone]

## 2015-08-22 ENCOUNTER — Encounter: Payer: Self-pay | Admitting: Internal Medicine

## 2015-09-03 ENCOUNTER — Encounter: Payer: Self-pay | Admitting: Internal Medicine

## 2015-09-03 ENCOUNTER — Ambulatory Visit (INDEPENDENT_AMBULATORY_CARE_PROVIDER_SITE_OTHER): Payer: Medicare Other | Admitting: Internal Medicine

## 2015-09-03 ENCOUNTER — Other Ambulatory Visit (INDEPENDENT_AMBULATORY_CARE_PROVIDER_SITE_OTHER): Payer: Medicare Other | Admitting: *Deleted

## 2015-09-03 VITALS — BP 114/62 | HR 60 | Temp 97.7°F | Resp 12 | Ht 72.0 in | Wt 223.0 lb

## 2015-09-03 DIAGNOSIS — E119 Type 2 diabetes mellitus without complications: Secondary | ICD-10-CM

## 2015-09-03 DIAGNOSIS — N182 Chronic kidney disease, stage 2 (mild): Secondary | ICD-10-CM | POA: Diagnosis not present

## 2015-09-03 DIAGNOSIS — E1122 Type 2 diabetes mellitus with diabetic chronic kidney disease: Secondary | ICD-10-CM | POA: Diagnosis not present

## 2015-09-03 DIAGNOSIS — Z794 Long term (current) use of insulin: Secondary | ICD-10-CM | POA: Diagnosis not present

## 2015-09-03 LAB — POCT GLYCOSYLATED HEMOGLOBIN (HGB A1C): Hemoglobin A1C: 7.4

## 2015-09-03 MED ORDER — INSULIN GLARGINE 100 UNIT/ML SOLOSTAR PEN: 28.0000 [IU] | PEN_INJECTOR | Freq: Every day | SUBCUTANEOUS | Status: AC

## 2015-09-03 MED ORDER — INSULIN LISPRO 100 UNIT/ML (KWIKPEN): 4.0000 [IU] | PEN_INJECTOR | Freq: Three times a day (TID) | SUBCUTANEOUS | Status: AC

## 2015-09-03 MED ORDER — INSULIN PEN NEEDLE 32G X 4 MM MISC: Status: AC

## 2015-09-03 NOTE — Patient Instructions (Signed)
Please decrease Lantus to 28 units at bedtime.  Stop Humalog 75/25.  Start Humalog 15 minutes before each of your 3 main meals: - 4 units before a smaller meal - 6 units before a larger meal  Please let me know if the sugars are consistently <80 or >200.  Please return in 1.5 months with your sugar log.   PATIENT INSTRUCTIONS FOR TYPE 2 DIABETES:  **Please join MyChart!** - see attached instructions about how to join if you have not done so already.  DIET AND EXERCISE Diet and exercise is an important part of diabetic treatment.  We recommended aerobic exercise in the form of brisk walking (working between 40-60% of maximal aerobic capacity, similar to brisk walking) for 150 minutes per week (such as 30 minutes five days per week) along with 3 times per week performing 'resistance' training (using various gauge rubber tubes with handles) 5-10 exercises involving the major muscle groups (upper body, lower body and core) performing 10-15 repetitions (or near fatigue) each exercise. Start at half the above goal but build slowly to reach the above goals. If limited by weight, joint pain, or disability, we recommend daily walking in a swimming pool with water up to waist to reduce pressure from joints while allow for adequate exercise.    BLOOD GLUCOSES Monitoring your blood glucoses is important for continued management of your diabetes. Please check your blood glucoses 2-4 times a day: fasting, before meals and at bedtime (you can rotate these measurements - e.g. one day check before the 3 meals, the next day check before 2 of the meals and before bedtime, etc.).   HYPOGLYCEMIA (low blood sugar) Hypoglycemia is usually a reaction to not eating, exercising, or taking too much insulin/ other diabetes drugs.  Symptoms include tremors, sweating, hunger, confusion, headache, etc. Treat IMMEDIATELY with 15 grams of Carbs: . 4 glucose tablets .  cup regular juice/soda . 2 tablespoons raisins . 4  teaspoons sugar . 1 tablespoon honey Recheck blood glucose in 15 mins and repeat above if still symptomatic/blood glucose <100.  RECOMMENDATIONS TO REDUCE YOUR RISK OF DIABETIC COMPLICATIONS: * Take your prescribed MEDICATION(S) * Follow a DIABETIC diet: Complex carbs, fiber rich foods, (monounsaturated and polyunsaturated) fats * AVOID saturated/trans fats, high fat foods, >2,300 mg salt per day. * EXERCISE at least 5 times a week for 30 minutes or preferably daily.  * DO NOT SMOKE OR DRINK more than 1 drink a day. * Check your FEET every day. Do not wear tightfitting shoes. Contact us if you develop an ulcer * See your EYE doctor once a year or more if needed * Get a FLU shot once a year * Get a PNEUMONIA vaccine once before and once after age 68 years  GOALS:  * Your Hemoglobin A1c of <7%  * fasting sugars need to be <130 * after meals sugars need to be <180 (2h after you start eating) * Your Systolic BP should be 140 or lower  * Your Diastolic BP should be 80 or lower  * Your HDL (Good Cholesterol) should be 40 or higher  * Your LDL (Bad Cholesterol) should be 100 or lower. * Your Triglycerides should be 150 or lower  * Your Urine microalbumin (kidney function) should be <30 * Your Body Mass Index should be 25 or lower    Please consider the following ways to cut down carbs and fat and increase fiber and micronutrients in your diet: - substitute whole grain for white bread or  pasta - substitute brown rice for white rice - substitute 90-calorie flat bread pieces for slices of bread when possible - substitute sweet potatoes or yams for white potatoes - substitute humus for margarine - substitute tofu for cheese when possible - substitute almond or rice milk for regular milk (would not drink soy milk daily due to concern for soy estrogen influence on breast cancer risk) - substitute dark chocolate for other sweets when possible - substitute water - can add lemon or orange slices  for taste - for diet sodas (artificial sweeteners will trick your body that you can eat sweets without getting calories and will lead you to overeating and weight gain in the long run) - do not skip breakfast or other meals (this will slow down the metabolism and will result in more weight gain over time)  - can try smoothies made from fruit and almond/rice milk in am instead of regular breakfast - can also try old-fashioned (not instant) oatmeal made with almond/rice milk in am - order the dressing on the side when eating salad at a restaurant (pour less than half of the dressing on the salad) - eat as little meat as possible - can try juicing, but should not forget that juicing will get rid of the fiber, so would alternate with eating raw veg./fruits or drinking smoothies - use as little oil as possible, even when using olive oil - can dress a salad with a mix of balsamic vinegar and lemon juice, for e.g. - use agave nectar, stevia sugar, or regular sugar rather than artificial sweateners - steam or broil/roast veggies  - snack on veggies/fruit/nuts (unsalted, preferably) when possible, rather than processed foods - reduce or eliminate aspartame in diet (it is in diet sodas, chewing gum, etc) Read the labels!  Try to read Dr. Janene Harvey book: "Program for Reversing Diabetes" for other ideas for healthy eating.

## 2015-09-03 NOTE — Progress Notes (Signed)
Patient ID: Shane Casey, male   DOB: 12/07/1945, 70 y.o.   MRN: [id number]  HPI: Shane Casey is a 70 y.o.-year-old male, referred by his PCP, Dr. 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  Last hemoglobin A1c was: Lab Results  Component Value Date   HGBA1C 8.1 06/11/2015   HGBA1C 14.3* 07/28/2012  07/27/2014: HbA1c 7.5%  Pt is on a regimen of: - Lantus 35 units at bedtime - Humalog 75/25 10 units in a.m.- added 10/2013 He was previously on metformin >> severe diarrhea.   Pt checks his sugars 1x a day and they are: - am: 67-122, 133 - 2h after b'fast: n/c - before lunch: n/c - 2h after lunch: n/c - before dinner: n/c - 2h after dinner: n/c - bedtime: n/c - nighttime: n/c + lows. Lowest sugar was 58 - at night; he has hypoglycemia awareness at 80.  Highest sugar was 191.  Glucometer: One Touch   Pt's meals are - low carb-high protein diet - Breakfast: sandwich with ham or tuna + V8 low sodium - Lunch: half a sandwich with meat + V8 juice or veggies - Dinner: red meat or tuna + V8 juice + veggies - Snacks: popcorn, chips + diet coke  - he has CKD, last BUN/creatinine:  Lab Results  Component Value Date   BUN 18 06/11/2015   CREATININE 1.4* 06/11/2015  On Lisinopril. - last set of lipids: 07/27/2014: 167/136/37/103 10/26/2013: 202/151/41/131 - last eye exam was in 06/2015. No DR.  - no numbness and tingling in his feet.  ? FH of DM - he is adopted.  Last TSH 2.75 on 07/27/2014. Thyroid tests have been normal consistently in the past. However, he tells me that he is sure he has a thyroid issue as is frequently cold extremities and he has long-standing fatigue. He takes a kelp supplement.   He also has a history of hypertension, GERD, anxiety/depression.  ROS: Constitutional: no weight gain/loss, + fatigue, no subjective hyperthermia/hypothermia Eyes: no blurry  vision, no xerophthalmia ENT: no sore throat, no nodules palpated in throat, no dysphagia/odynophagia, no hoarseness Cardiovascular: no CP/SOB/palpitations/leg swelling Respiratory: no cough/SOB Gastrointestinal: no N/V/D/C/+ acid reflux Musculoskeletal: no muscle/joint aches Skin: no rashes Neurological: + tremors/no numbness/tingling/dizziness Psychiatric: + Both: depression/anxiety  Past Medical History  Diagnosis Date  . ADD (attention deficit disorder) without hyperactivity   . Depression   . Anxiety   . DM type 2 (diabetes mellitus, type 2) (HCC)   . GERD (gastroesophageal reflux disease)   . IBS (irritable bowel syndrome)    Past Surgical History  Procedure Laterality Date  . Shoulder surgery  2014    ROTATOR CUFF REPAIR  . Appendectomy    . Knee surgery      X 3  . Colonscopy      X2   Social History   Social History  . Marital Status: Widowed    Spouse Name: N/A  . Number of Children: 0   Occupational History  . Retired    Social History Main Topics  . Smoking status: Former Smoker -- 1.00 packs/day for 25 years, Quit in 1992     Types: Cigarettes  . Smokeless tobacco: Not on file  . Alcohol Use: No  . Drug Use: No   Current Outpatient Prescriptions on File Prior to Visit  Medication Sig Dispense Refill  . ALPRAZolam (XANAX) 0.25 MG tablet Take 0.125 mg by mouth at bedtime as  needed. For sleep  2  . amLODipine (NORVASC) 5 MG tablet Take 5 mg by mouth daily.  1  . fluocinonide gel (LIDEX) 0.05 % Apply 1 application topically daily as needed (for rash).    Marland Kitchen HUMALOG MIX 75/25 KWIKPEN (75-25) 100 UNIT/ML Kwikpen Inject 10 Units into the skin every morning.     . insulin glargine (LANTUS) 100 UNIT/ML injection Inject 35 Units into the skin at bedtime.     Marland Kitchen lisinopril (PRINIVIL,ZESTRIL) 20 MG tablet Take 20 mg by mouth daily.  3  . methylphenidate (RITALIN) 10 MG tablet Take 1 tablet (10 mg total) by mouth 2 (two) times daily with breakfast and lunch.    .  sildenafil (VIAGRA) 100 MG tablet Take 100 mg by mouth daily as needed for erectile dysfunction.    Marland Kitchen tiZANidine (ZANAFLEX) 4 MG tablet Take 2 mg by mouth every 6 (six) hours as needed for muscle spasms.    . hydrochlorothiazide (MICROZIDE) 12.5 MG capsule Take 12.5 mg by mouth daily. Reported on 09/03/2015     No current facility-administered medications on file prior to visit.   Allergies  Allergen Reactions  . Lipitor [Atorvastatin]     "raises my blood pressure"  . Metformin And Related Diarrhea   Family History  Problem Relation Age of Onset  . Adopted: Yes  . Other      PATIENT IS ADOPTED   PE: BP 114/62 mmHg  Pulse 60  Temp(Src) 97.7 F (36.5 C) (Oral)  Resp 12  Ht 6' (1.829 m)  Wt 223 lb (101.152 kg)  BMI 30.24 kg/m2  SpO2 95% Wt Readings from Last 3 Encounters:  09/03/15 223 lb (101.152 kg)  07/31/15 225 lb 1.9 oz (102.114 kg)  04/29/15 216 lb (97.977 kg)   Constitutional: overweight, in NAD Eyes: PERRLA, EOMI, no exophthalmos ENT: moist mucous membranes, no thyromegaly, no cervical lymphadenopathy Cardiovascular: RRR, No MRG Respiratory: CTA B Gastrointestinal: abdomen soft, NT, ND, BS+ Musculoskeletal: no deformities, strength intact in all 4 Skin: moist, warm, no rashes Neurological: + tremor with outstretched hands, DTR normal in all 4  ASSESSMENT: 1. 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 Patient with long-standing, uncontrolled diabetes, on basal + premixed insulin regimen, which became insufficient. A HbA1c checked today is 7.4%. His sugars are at goal in the morning, and even has lows in the 60s. Unfortunately, he only checks his sugars in the morning, so it is difficult to draw conclusions about sugars later in the day, but my suspicion is that they increase as the day goes by, in a stepwise fashion, consistent with insufficient mealtime insulin. - We discussed about diet and I suggested changes in  his current dietary regimen. - We discussed about his current regimen that contains almost exclusively long-acting insulin (a total of 35+7.5 = 42.5 units per day as opposed to 2.5 units rapid insulin per day). I explained that the balanced regimen will contain approximately the same amount of long-acting and short-acting insulin per day. Therefore, will go ahead and decrease the dose of basal insulin and increase the dose of rapid acting insulin with his meals. I sent a prescription for Lantus pens to the pharmacy (he now uses a vial) and given him a $10 coupon card. I also sent Humalog pens and advised him to use them 15 minutes before a meal.  - I also advised him not to keep the insulin in use in the fridge - He injects  insulin in his thighs - I suggested to:  Patient Instructions  Please decrease Lantus to 28 units at bedtime.  Stop Humalog 75/25.  Start Humalog 15 minutes before each of your 3 main meals: - 4 units before a smaller meal - 6 units before a larger meal  Please let me know if the sugars are consistently <80 or >200.  Please return in 1.5 months with your sugar log.   - Strongly advised him to start checking sugars at different times of the day - check 2-3 times a day, rotating checks - given sugar log and advised how to fill it and to bring it at next appt  - given foot care handout and explained the principles  - given instructions for hypoglycemia management "15-15 rule"  - advised for yearly eye exams >> he is up-to-date - Return to clinic in 1.5 mo with sugar log

## 2015-09-24 ENCOUNTER — Telehealth: Payer: Self-pay | Admitting: Internal Medicine

## 2015-09-24 NOTE — Telephone Encounter (Signed)
Returned pt's call. Pt stated his blood sugars have been in the 130-140's.  Sunday    Mon    Tues 132 AM    132 6:30 AM  136 AM 143 2 hrs after breakfast  Pt ate spaghetti at 8:30 219 2 hrs after breakfast  Pt is very concerned that his blood sugar has increased since he began the new regimen. Pt has done some traveling and will be doing some more. Pt wants to know is this normal protocol.  Please advise.

## 2015-09-24 NOTE — Telephone Encounter (Signed)
Called Shane Casey and advised him per Dr Charlean SanfilippoGherghe's message. He voiced understanding and will let us know by the end of the week how his sugar levels are doing.

## 2015-09-24 NOTE — Telephone Encounter (Signed)
Since the start of the new machine with lower insulin he has had many highs over 200

## 2015-09-24 NOTE — Telephone Encounter (Signed)
Please increase mealtime Humalog as follows: - 4 >> 6 units before a smaller meal - 6 >> 8 units before a larger meal

## 2015-10-21 ENCOUNTER — Ambulatory Visit: Payer: Medicare Other | Admitting: Internal Medicine

## 2017-05-07 ENCOUNTER — Other Ambulatory Visit: Payer: Self-pay | Admitting: Family Medicine

## 2017-05-07 DIAGNOSIS — R911 Solitary pulmonary nodule: Secondary | ICD-10-CM

## 2017-05-07 DIAGNOSIS — R0602 Shortness of breath: Secondary | ICD-10-CM

## 2017-05-12 ENCOUNTER — Ambulatory Visit
Admission: RE | Admit: 2017-05-12 | Discharge: 2017-05-12 | Disposition: A | Discharge status: Home / Self Care | Payer: Medicare Other | Source: Ambulatory Visit | Attending: Family Medicine | Admitting: Family Medicine

## 2017-05-12 DIAGNOSIS — R911 Solitary pulmonary nodule: Secondary | ICD-10-CM

## 2017-05-12 DIAGNOSIS — R0602 Shortness of breath: Secondary | ICD-10-CM

## 2018-02-08 ENCOUNTER — Emergency Department (HOSPITAL_COMMUNITY): Payer: Medicare Other

## 2018-02-08 ENCOUNTER — Emergency Department (HOSPITAL_COMMUNITY)
Admission: EM | Admit: 2018-02-08 | Discharge: 2018-02-08 | Disposition: A | Discharge status: Home / Self Care | Payer: Medicare Other | Attending: Emergency Medicine | Admitting: Emergency Medicine

## 2018-02-08 ENCOUNTER — Encounter (HOSPITAL_COMMUNITY): Payer: Self-pay | Admitting: Emergency Medicine

## 2018-02-08 DIAGNOSIS — E119 Type 2 diabetes mellitus without complications: Secondary | ICD-10-CM | POA: Diagnosis not present

## 2018-02-08 DIAGNOSIS — Y999 Unspecified external cause status: Secondary | ICD-10-CM | POA: Insufficient documentation

## 2018-02-08 DIAGNOSIS — S060X0A Concussion without loss of consciousness, initial encounter: Secondary | ICD-10-CM

## 2018-02-08 DIAGNOSIS — Y939 Activity, unspecified: Secondary | ICD-10-CM | POA: Insufficient documentation

## 2018-02-08 DIAGNOSIS — Z794 Long term (current) use of insulin: Secondary | ICD-10-CM | POA: Insufficient documentation

## 2018-02-08 DIAGNOSIS — E860 Dehydration: Secondary | ICD-10-CM | POA: Diagnosis not present

## 2018-02-08 DIAGNOSIS — Y929 Unspecified place or not applicable: Secondary | ICD-10-CM | POA: Diagnosis not present

## 2018-02-08 DIAGNOSIS — F909 Attention-deficit hyperactivity disorder, unspecified type: Secondary | ICD-10-CM | POA: Insufficient documentation

## 2018-02-08 DIAGNOSIS — Z87891 Personal history of nicotine dependence: Secondary | ICD-10-CM | POA: Insufficient documentation

## 2018-02-08 DIAGNOSIS — X58XXXA Exposure to other specified factors, initial encounter: Secondary | ICD-10-CM | POA: Diagnosis not present

## 2018-02-08 DIAGNOSIS — R42 Dizziness and giddiness: Secondary | ICD-10-CM | POA: Diagnosis present

## 2018-02-08 LAB — CBC
HCT: 47.3 % (ref 39.0–52.0)
Hemoglobin: 15.3 g/dL (ref 13.0–17.0)
MCH: 30.8 pg (ref 26.0–34.0)
MCHC: 32.3 g/dL (ref 30.0–36.0)
MCV: 95.2 fL (ref 78.0–100.0)
PLATELETS: 125 10*3/uL — AB (ref 150–400)
RBC: 4.97 MIL/uL (ref 4.22–5.81)
RDW: 12.7 % (ref 11.5–15.5)
WBC: 10.3 10*3/uL (ref 4.0–10.5)

## 2018-02-08 LAB — COMPREHENSIVE METABOLIC PANEL
ALK PHOS: 54 U/L (ref 38–126)
ALT: 30 U/L (ref 0–44)
ANION GAP: 11 (ref 5–15)
AST: 21 U/L (ref 15–41)
Albumin: 3.5 g/dL (ref 3.5–5.0)
BUN: 18 mg/dL (ref 8–23)
CO2: 25 mmol/L (ref 22–32)
CREATININE: 1.68 mg/dL — AB (ref 0.61–1.24)
Calcium: 9 mg/dL (ref 8.9–10.3)
Chloride: 100 mmol/L (ref 98–111)
GFR calc non Af Amer: 40 mL/min — ABNORMAL LOW (ref >60)
GFR, EST AFRICAN AMERICAN: 46 mL/min — AB (ref >60)
Glucose, Bld: 180 mg/dL — ABNORMAL HIGH (ref 70–99)
POTASSIUM: 4.4 mmol/L (ref 3.5–5.1)
Sodium: 136 mmol/L (ref 135–145)
Total Bilirubin: 1.6 mg/dL — ABNORMAL HIGH (ref 0.3–1.2)
Total Protein: 6.3 g/dL — ABNORMAL LOW (ref 6.5–8.1)

## 2018-02-08 LAB — URINALYSIS, ROUTINE W REFLEX MICROSCOPIC
BILIRUBIN URINE: NEGATIVE
Glucose, UA: NEGATIVE mg/dL
Hgb urine dipstick: NEGATIVE
Ketones, ur: 20 mg/dL — AB
LEUKOCYTES UA: NEGATIVE
Nitrite: NEGATIVE
PROTEIN: NEGATIVE mg/dL
Specific Gravity, Urine: 1.027 (ref 1.005–1.030)
pH: 5 (ref 5.0–8.0)

## 2018-02-08 LAB — LIPASE, BLOOD: Lipase: 27 U/L (ref 11–51)

## 2018-02-08 MED ORDER — LACTATED RINGERS IV BOLUS
1000.0000 mL | Freq: Once | INTRAVENOUS | Status: AC
  Administered 2018-02-08: 1000 mL via INTRAVENOUS

## 2018-02-08 MED ORDER — GADOBUTROL 1 MMOL/ML IV SOLN
10.0000 mL | Freq: Once | INTRAVENOUS | Status: AC | PRN
  Administered 2018-02-08: 10 mL via INTRAVENOUS

## 2018-02-08 NOTE — ED Notes (Signed)
Patient resting on stretcher , sats dropping into the high 80's , 89% on RA , placed on 2 liters/[place] increased sat to 94%
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
## 2018-02-08 NOTE — ED Notes (Signed)
Pt verbalized understanding of d/c instructions and has no further questions, VSS, NAD.  

## 2018-02-08 NOTE — ED Notes (Signed)
Patient moved to recliner in pod a hallway. Pt a/ox4, resp e/u, nad.

## 2018-02-08 NOTE — Discharge Instructions (Signed)
Follow up with primary care doctor this week. Drink plenty of fluids and maintain a bland diet.  Return to the ED for any worsening or other concerns. Take motrin and tylenol as needed for pain.

## 2018-02-08 NOTE — ED Notes (Signed)
Transported to CT 

## 2018-02-08 NOTE — ED Notes (Signed)
Pt stated that he is unable to provide a urine sample at this time.
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
## 2018-02-08 NOTE — ED Notes (Signed)
Returned from MRI 

## 2018-02-08 NOTE — ED Notes (Signed)
Gave pt a urinal 
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
## 2018-02-08 NOTE — ED Notes (Signed)
Patient transported to X-ray 

## 2019-06-06 ENCOUNTER — Ambulatory Visit: Payer: Medicare Other | Attending: Internal Medicine

## 2019-06-06 DIAGNOSIS — Z20822 Contact with and (suspected) exposure to covid-19: Secondary | ICD-10-CM

## 2019-06-07 LAB — NOVEL CORONAVIRUS, NAA: SARS-CoV-2, NAA: NOT DETECTED

## 2020-04-26 LAB — EXTERNAL GENERIC LAB PROCEDURE: COLOGUARD: NEGATIVE

## 2021-06-03 ENCOUNTER — Ambulatory Visit: Payer: Self-pay

## 2021-06-03 ENCOUNTER — Ambulatory Visit: Payer: Medicare Other | Admitting: Sports Medicine

## 2021-06-03 ENCOUNTER — Ambulatory Visit
Admission: RE | Admit: 2021-06-03 | Discharge: 2021-06-03 | Disposition: A | Discharge status: Home / Self Care | Payer: Medicare Other | Source: Ambulatory Visit | Attending: Sports Medicine | Admitting: Sports Medicine

## 2021-06-03 VITALS — BP 128/82 | Ht 73.5 in | Wt 205.0 lb

## 2021-06-03 DIAGNOSIS — M25531 Pain in right wrist: Secondary | ICD-10-CM | POA: Diagnosis not present
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
## 2021-06-03 NOTE — Patient Instructions (Signed)
It was great to meet you today, thank you for letting me participate in your care.  Today, we discussed your ongoing right wrist pain.  I am concerned there could be an underlying fracture of the wrist.  There also could be a TFCC cartilage tear.  To better evaluate for any bony abnormality or fracture, we will obtain x-rays of the wrist.  Dr. Margaretha Sheffield will call you with these results likely tomorrow.  -You will continue your physical therapy if you desire in the meantime until we have the x-ray -You may use ice over this area -We have options for a wrist brace that still allows you to be functional, you may call or return anytime if you desire this  We will discuss follow-up based on her x-ray report after speaking with Dr. Margaretha Sheffield.  If you have any further questions, please give the clinic a call [phone].  Cheers,  Madelyn Brunner, DO Sports Medicine Fellow [facility]

## 2021-06-04 NOTE — Addendum Note (Signed)
Addended by: Rutha Bouchard E on: 06/04/2021 11:02 AM   Modules accepted: Orders

## 2021-06-10 ENCOUNTER — Ambulatory Visit: Payer: Medicare Other | Admitting: Orthopedic Surgery

## 2021-06-10 ENCOUNTER — Encounter: Payer: Self-pay | Admitting: Orthopedic Surgery

## 2021-06-10 ENCOUNTER — Other Ambulatory Visit: Payer: Self-pay

## 2021-06-10 DIAGNOSIS — M778 Other enthesopathies, not elsewhere classified: Secondary | ICD-10-CM | POA: Diagnosis not present

## 2021-06-10 DIAGNOSIS — M25531 Pain in right wrist: Secondary | ICD-10-CM | POA: Insufficient documentation

## 2021-06-10 MED ORDER — MELOXICAM 7.5 MG PO TABS: 7.5000 mg | ORAL_TABLET | Freq: Every day | ORAL | 0 refills | Status: AC
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
## 2021-07-14 ENCOUNTER — Encounter: Payer: Self-pay | Admitting: Orthopedic Surgery

## 2021-07-14 ENCOUNTER — Other Ambulatory Visit: Payer: Self-pay

## 2021-07-14 ENCOUNTER — Ambulatory Visit: Payer: Medicare Other | Admitting: Orthopedic Surgery

## 2021-07-14 VITALS — Ht 73.5 in | Wt 205.0 lb

## 2021-07-14 DIAGNOSIS — M778 Other enthesopathies, not elsewhere classified: Secondary | ICD-10-CM

## 2021-07-14 NOTE — Progress Notes (Signed)
Office Visit Note   Patient: Shane Casey           Date of Birth: 05-07-1946           MRN: [id number] Visit Date: 07/14/2021              Requested by: Barbie Banner, MD [address] [place],  [place] [address] PCP: Barbie Banner, MD   Assessment & Plan: Visit Diagnoses:  1. Tendonitis of wrist, right     Plan: Patient's wrist pain is much improved since his last visit approximately one month ago.  He has been wearing a brace, taking oral NSAIDs, and has been in therapy.  He is able to exercise at the gym with minimal symptoms.  He can follow up with again as needed.   Follow-Up Instructions: No follow-ups on file.   Orders:  No orders of the defined types were placed in this encounter.  No orders of the defined types were placed in this encounter.     Procedures: No procedures performed   Clinical Data: No additional findings.   Subjective: Chief Complaint  Patient presents with   Right Wrist - Pain, Follow-up    This is a 76 year old right-hand-dominant male who presents for follow up of ulnar-sided right wrist pain.  He was last seen approximately one month ago at which time he was having dorsal ulnar wrist pain consistent with ECU tendonitis.  His wrist pain is much improved today with bracing, oral NSAIDs, and therapy.  He is able to exercise at the gym with minimal symptoms.  He is very happy with his progress so far.    Review of Systems   Objective: Vital Signs: Ht 6' 1.5" (1.867 m)    Wt 205 lb (93 kg)    BMI 26.68 kg/m   Physical Exam Constitutional:      Appearance: Normal appearance.  Cardiovascular:     Rate and Rhythm: Normal rate.     Pulses: Normal pulses.  Pulmonary:     Effort: Pulmonary effort is normal.  Skin:    General: Skin is warm and dry.     Capillary Refill: Capillary refill takes less than 2 seconds.    Right Hand Exam   Tenderness  The patient is experiencing no tenderness.   Range of Motion  The patient has  normal right wrist ROM.   Other  Erythema: absent Sensation: normal Pulse: present  Comments:  Neg ECU synergy test (previously positive). No DRUJ instability.  No ECU instability.  No pain w/ ulnocarpal loading.      Specialty Comments:  No specialty comments available.  Imaging: No results found.   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   Opiate overdose (HCC) 07/28/2012   S/P shoulder surgery 07/28/2012   Urinary retention 07/28/2012   ADD (attention deficit disorder) without hyperactivity    Depression    Anxiety    GERD (gastroesophageal reflux disease)    IBS (irritable bowel syndrome)    Past Medical History:  Diagnosis Date   ADD (attention deficit disorder) without hyperactivity    Anxiety    Depression    DM type 2 (diabetes mellitus, type 2) (  HCC)    GERD (gastroesophageal reflux disease)    IBS (irritable bowel syndrome)     Family History  Adopted: Yes  Problem Relation Age of Onset   Other Unknown        PATIENT IS ADOPTED    Past Surgical History:  Procedure Laterality Date   APPENDECTOMY     COLONSCOPY     X2   KNEE SURGERY     X 3   SHOULDER SURGERY  2014   ROTATOR CUFF REPAIR   Social History   Occupational History   Not on file  Tobacco Use   Smoking status: Former    Packs/day: 1.00    Years: 25.00    Pack years: 25.00    Types: Cigarettes   Smokeless tobacco: Not on file  Substance and Sexual Activity   Alcohol use: No   Drug use: No   Sexual activity: Not on file

## 2021-07-15 ENCOUNTER — Ambulatory Visit: Payer: Self-pay

## 2021-07-15 ENCOUNTER — Ambulatory Visit: Payer: Medicare Other | Admitting: Sports Medicine

## 2021-07-15 VITALS — BP 136/72 | Ht 73.0 in | Wt 200.0 lb

## 2021-07-15 DIAGNOSIS — M25561 Pain in right knee: Secondary | ICD-10-CM

## 2021-07-15 DIAGNOSIS — M7041 Prepatellar bursitis, right knee: Secondary | ICD-10-CM

## 2021-07-15 NOTE — Progress Notes (Addendum)
° °  Subjective:    Patient ID: Shane Casey, male    DOB: 03-09-1946, 76 y.o.   MRN: [id number]  HPI chief complaint: Right knee swelling  Shane Casey presents today complaining of 6 days of anterior right knee swelling.  He denies any trauma but does recall doing quite a bit of gardening prior to noticing the swelling.  It is most painful with direct pressure such as kneeling down on the right knee.  He denies pain elsewhere in the knee.  He does have a remote history of ACL and meniscal tear.  He states that the swelling has improved somewhat over the past 6 days.  He denies any prior occurrences.  Past medical history reviewed Medications reviewed Allergies reviewed    Review of Systems As above    Objective:   Physical Exam  Well-developed, well-nourished.  No acute distress  Right knee: Full range of motion.  No effusion.  There is a small fluctuant bursa on the anterior patella.  It is not erythematous and not tender to palpation.  It is not warm to touch.  His knee is stable to valgus and varus stressing.  I do not appreciate any significant laxity with anterior drawer testing.  PCL is intact.  Negative McMurray's.  Neurovascularly intact distally.  Limited MSK of the anterior right knee:  There is a small anechoic area anterior to the patella which is seen on both long and short axis views consistent with a very small prepatellar bursitis, likely posttraumatic in nature.      Assessment & Plan:   Right knee prepatellar bursitis  Today's ultrasound suggest that this may be a posttraumatic bursitis with a resolving hematoma.  It is certainly not large enough to drain.  I recommended that Shane Casey obtain some well-padded kneepads to wear when gardening.  I also recommended that he avoid any trauma to the anterior knee.  His symptoms should continue to improve.  If this is not the case or he has increased swelling in the bursa and he will return to the office for  reevaluation.  This note was dictated using Dragon naturally speaking software and may contain errors in syntax, spelling, or content which have not been identified prior to signing this note.

## 2021-11-11 ENCOUNTER — Ambulatory Visit: Payer: Medicare Other | Admitting: Orthopedic Surgery

## 2021-11-11 DIAGNOSIS — R200 Anesthesia of skin: Secondary | ICD-10-CM

## 2021-11-11 NOTE — Progress Notes (Signed)
Office Visit Note   Patient: Shane Casey           Date of Birth: 1945-05-27           MRN: [id number] Visit Date: 11/11/2021              Requested by: Barbie Banner, MD [address] [place],  [place] [address] PCP: Barbie Banner, MD   Assessment & Plan: Visit Diagnoses:  1. Numbness in both hands     Plan: Discussed with patient that his history exam findings do seem consistent with carpal tunnel syndrome.  We reviewed the nature of carpal tunnel syndrome as well as his diagnosis, prognosis and both conservative and surgical treatment options.  I would like to get an EMG/nerve conduction study to further evaluate his symptoms.  I can see him back in the office once that is completed.  In the meantime, I will provide bilateral wrist splints to be worn at night.  Follow-Up Instructions: No follow-ups on file.   Orders:  No orders of the defined types were placed in this encounter.  No orders of the defined types were placed in this encounter.     Procedures: No procedures performed   Clinical Data: No additional findings.   Subjective: Chief Complaint  Patient presents with   Right Hand - Pain   Left Hand - Pain    This is a 76 year old right-hand-dominant male who presents with bilateral hand numbness and tingling.  He was seen back in February for ECU tendinitis.  This is resolved.  His new numbness and tingling symptoms have started over the last 6 weeks or so.  The right and left are equally affected.  He describes painful numbness and paresthesias in the thumb, index, and middle fingers.  The ring finger and small finger not involved.  This wakes him up early in the morning 7 days/week.  He takes arthritis strength Tylenol with some symptom relief.  He is never worn any wrist braces.  Has never had any electrodiagnostic studies.    Review of Systems   Objective: Vital Signs: There were no vitals taken for this visit.  Physical  Exam Constitutional:      Appearance: Normal appearance.  Cardiovascular:     Rate and Rhythm: Normal rate.     Pulses: Normal pulses.  Pulmonary:     Effort: Pulmonary effort is normal.  Skin:    General: Skin is warm and dry.     Capillary Refill: Capillary refill takes less than 2 seconds.  Neurological:     Mental Status: He is alert.     Right Hand Exam   Tenderness  The patient is experiencing no tenderness.   Other  Erythema: absent Sensation: normal Pulse: present  Comments:  Positive Tinel and Durkan signs.  5/5 thenar motor strength.    Left Hand Exam   Tenderness  The patient is experiencing no tenderness.   Other  Erythema: absent Sensation: normal Pulse: present  Comments:  Positive Tinel and Durkan signs.  5/5 thenar motor strength.       Specialty Comments:  No specialty comments available.  Imaging: No results found.   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   Opiate overdose (HCC) 07/28/2012   S/P shoulder surgery 07/28/2012   Urinary retention 07/28/2012   ADD (attention deficit disorder) without hyperactivity    Depression    Anxiety    GERD (gastroesophageal reflux disease)    IBS (irritable bowel syndrome)    Past Medical History:  Diagnosis Date   ADD (attention deficit disorder) without hyperactivity    Anxiety    Depression    DM type 2 (diabetes mellitus, type 2) (HCC)    GERD (gastroesophageal reflux disease)    IBS (irritable bowel syndrome)     Family History  Adopted: Yes  Problem Relation Age of Onset   Other Unknown        PATIENT IS ADOPTED    Past Surgical History:   Procedure Laterality Date   APPENDECTOMY     COLONSCOPY     X2   KNEE SURGERY     X 3   SHOULDER SURGERY  2014   ROTATOR CUFF REPAIR   Social History   Occupational History   Not on file  Tobacco Use   Smoking status: Former    Packs/day: 1.00    Years: 25.00    Total pack years: 25.00    Types: Cigarettes   Smokeless tobacco: Not on file  Substance and Sexual Activity   Alcohol use: No   Drug use: No   Sexual activity: Not on file

## 2021-11-13 ENCOUNTER — Telehealth: Payer: Self-pay | Admitting: Physical Medicine and Rehabilitation

## 2021-11-13 NOTE — Telephone Encounter (Signed)
Pt called requesting a call to set an appt. Please call pt at [phone]

## 2021-11-20 ENCOUNTER — Telehealth: Payer: Self-pay | Admitting: Orthopedic Surgery

## 2021-11-20 ENCOUNTER — Other Ambulatory Visit: Payer: Self-pay | Admitting: Physician Assistant

## 2021-11-20 MED ORDER — GABAPENTIN 300 MG PO CAPS: ORAL_CAPSULE | ORAL | 0 refills | Status: DC

## 2021-11-20 NOTE — Telephone Encounter (Signed)
I called and advised that this was sent in to his pharmacy.

## 2021-11-20 NOTE — Telephone Encounter (Signed)
Pt called requesting some medication for nerve pain. Pt has an upcoming appt with Dr. Alvester Morin but need something to help with nerve pains. Please send to pharmacy on file. Pt phone number is [phone]

## 2021-11-20 NOTE — Telephone Encounter (Signed)
Sent in gabapentin

## 2021-11-24 ENCOUNTER — Telehealth: Payer: Self-pay | Admitting: Orthopedic Surgery

## 2021-11-24 NOTE — Telephone Encounter (Signed)
Please advise 

## 2021-11-24 NOTE — Telephone Encounter (Signed)
Patient called advised he had a reaction to the Gabapentin and have stopped taking it as of yesterday.  Patient said he was having hallucinations,shaking in his neck and hands.  Patient said he is in a lot of pain.  Patient asked if he can take Oxycodone with Acetaminophen,hydrocodone with Acetaminophen or soma. The number to contact patient is [phone]

## 2021-11-25 NOTE — Telephone Encounter (Signed)
I called and lmom for pt to let him know what Dr. Frazier Butt said, advised Ibuprofen 800mg  tid, Aleve 2 tabs bid, or up to 2500mg  of tylenol daily. Advised that if he did not understand to call me back

## 2021-12-17 ENCOUNTER — Ambulatory Visit: Payer: Medicare Other | Admitting: Physical Medicine and Rehabilitation

## 2021-12-17 DIAGNOSIS — R202 Paresthesia of skin: Secondary | ICD-10-CM

## 2021-12-17 NOTE — Progress Notes (Signed)
Numeric Pain Rating Scale and Functional Assessment Average Pain 5  Numbness and tingling in both extremeties. Tremors on the right.  In the last MONTH (on 0-10 scale) has pain interfered with the following?  1. General activity like being  able to carry out your everyday physical activities such as walking, climbing stairs, carrying groceries, or moving a chair?  Rating(10)

## 2021-12-19 NOTE — Progress Notes (Signed)
Shane Casey - 76 y.o. male MRN [id number]  Date of birth: [birth year]  Office Visit Note: Visit Date: 12/17/2021 PCP: Christain Sacramento, MD Referred by: Sherilyn Cooter, MD  Subjective: Chief Complaint  Patient presents with   Right Wrist - Pain   Left Wrist - Pain   HPI:  Shane Casey is a 76 y.o. male who comes in today at the request of Dr. Sherilyn Cooter for electrodiagnostic study of the Bilateral upper extremities.  Patient is Right hand dominant.  He reports chronic several month history of increasing pain numbness and tingling in both hands particularly in the radial digits.  No frank radicular symptoms.  Does complain of tremor.  This has been diagnosed as essential tremor and has not really had much treatment.  He reports severe pain that is really limiting his activities.  No prior electrodiagnostic studies.   ROS Otherwise per HPI.  Assessment & Plan: Visit Diagnoses:    ICD-10-CM   1. 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  There is evidence of axonal injury and likely has some residual symptoms even with decompression.  EMG instability of the right FDI muscle along with some atrophy compared to the left could indicate a concomitant C8 radiculopathy but no other findings to confirm this.  Ulnar nerve on the right was normal.   There is no significant electrodiagnostic evidence of any other focal nerve entrapment or brachial plexopathy.   Recommendations: 1.  Follow-up with referring physician. 2.  Continue current management of symptoms. 3.  Continue use of resting splint at night-time and as needed during the day. 4.  Suggest surgical evaluation.  Meds & Orders: No orders of the defined types were placed in this encounter.    Orders Placed This Encounter  Procedures   NCV with EMG (electromyography)    Follow-up: Return in about 2 weeks (around 12/31/2021) for Sherilyn Cooter, MD.   Procedures: No procedures performed  EMG & NCV Findings: Evaluation of the left median motor and the right median motor nerves showed prolonged distal onset latency (L10.2, R8.8 ms), reduced amplitude (L1.9, R0.2 mV), and decreased conduction velocity (Elbow-Wrist, L40, R41 m/s).  The left ulnar motor nerve showed decreased conduction velocity (A Elbow-B Elbow, 48 m/s).  The right ulnar motor nerve showed decreased conduction velocity (B Elbow-Wrist, 48 m/s).  The left median (across palm) sensory and the right median (across palm) sensory nerves showed prolonged distal peak latency (Wrist, L19.8, R7.0 ms), reduced amplitude (L0.1, R6.9 V), and prolonged distal peak latency (Palm, L6.4, R5.3 ms).  All remaining nerves (as indicated in the following tables) were within normal limits.  Left vs. Right side comparison data for the median motor nerve indicates abnormal L-R latency difference (1.4 ms) and abnormal L-R amplitude difference (89.5 %).  All remaining left vs. right side differences were within normal limits.    Needle evaluation of the right abductor pollicis brevis muscle showed decreased insertional activity, increased spontaneous activity, and diminished recruitment.  The right first dorsal interosseous muscle showed slightly increased spontaneous activity.  All remaining muscles (as indicated in the following table) showed no evidence of electrical instability.    Impression: The above electrodiagnostic study is ABNORMAL and reveals evidence of a severe bilateral median nerve entrapment at the wrist (carpal tunnel  syndrome) affecting sensory and motor components.  There is evidence of axonal injury and likely has some residual symptoms even with decompression.  EMG instability of the right FDI muscle along with some atrophy  compared to the left could indicate a concomitant C8 radiculopathy but no other findings to confirm this.  Ulnar nerve on the right was normal.   There is no significant electrodiagnostic evidence of any other focal nerve entrapment or brachial plexopathy.   Recommendations: 1.  Follow-up with referring physician. 2.  Continue current management of symptoms. 3.  Continue use of resting splint at night-time and as needed during the day. 4.  Suggest surgical evaluation.  ___________________________ Shane Casey FAAPMR Board Certified, American Board of Physical Medicine and Rehabilitation    Nerve Conduction Studies Anti Sensory Summary Table   Stim Site NR Peak (ms) Norm Peak (ms) P-T Amp (V) Norm P-T Amp Site1 Site2 Delta-P (ms) Dist (cm) Vel (m/s) Norm Vel (m/s)  Left Median Acr Palm Anti Sensory (2nd Digit)  32.1C  Wrist    *19.8 <3.6 *0.1 >10 Wrist Palm 13.4 0.0    Palm    *6.4 <2.0 8.8         Right Median Acr Palm Anti Sensory (2nd Digit)  31.9C  Wrist    *7.0 <3.6 *6.9 >10 Wrist Palm 1.7 0.0    Palm    *5.3 <2.0 2.7         Left Radial Anti Sensory (Base 1st Digit)  31.7C  Wrist    2.3 <3.1 9.0  Wrist Base 1st Digit 2.3 0.0    Right Radial Anti Sensory (Base 1st Digit)  31.7C  Wrist    2.4 <3.1 13.3  Wrist Base 1st Digit 2.4 0.0    Left Ulnar Anti Sensory (5th Digit)  32.2C  Wrist    3.6 <3.7 20.2 >15.0 Wrist 5th Digit 3.6 14.0 39 >38  Right Ulnar Anti Sensory (5th Digit)  32.2C  Wrist    3.4 <3.7 19.5 >15.0 Wrist 5th Digit 3.4 14.0 41 >38   Motor Summary Table   Stim Site NR Onset (ms) Norm Onset (ms) O-P Amp (mV) Norm O-P Amp Site1 Site2 Delta-0 (ms) Dist (cm) Vel (m/s) Norm Vel (m/s)  Left Median Motor (Abd Poll Brev)  32C  Wrist    *10.2 <4.2 *1.9 >5 Elbow Wrist 5.7 23.0 *40 >50  Elbow    15.9  1.3         Right Median Motor (Abd Poll Brev)  31.8C  Wrist    *8.8 <4.2 *0.2 >5 Elbow Wrist 5.9 24.0 *41 >50  Elbow    14.7  0.3         Left Ulnar Motor (Abd  Dig Min)  32C  Wrist    3.2 <4.2 4.4 >3 B Elbow Wrist 4.1 22.0 54 >53  B Elbow    7.3  4.4  A Elbow B Elbow 2.3 11.0 *48 >53  A Elbow    9.6  2.7         Right Ulnar Motor (Abd Dig Min)  31.7C  Wrist    2.8 <4.2 4.3 >3 B Elbow Wrist 4.6 22.0 *48 >53  B Elbow    7.4  4.1  A Elbow B Elbow 1.8 11.0 61 >53  A Elbow    9.2  4.3          EMG   Side Muscle Nerve Root Ins Act Fibs Psw Amp Dur Poly Recrt Int Dennie Bible Comment  Right Abd Poll Brev Median C8-T1 *Decr *3+ *3+ Nml Nml 0 *Reduced Nml active MUAps  Right 1stDorInt Ulnar C8-T1 Nml *1+ *1+ Nml Nml 0 Nml Nml   Right PronatorTeres Median C6-7 Nml Nml Nml Nml Nml 0 Nml Nml   Right Biceps Musculocut C5-6 Nml Nml Nml Nml Nml 0 Nml Nml   Right Deltoid Axillary C5-6 Nml Nml Nml Nml Nml 0 Nml Nml     Nerve Conduction Studies Anti Sensory Left/Right Comparison   Stim Site L Lat (ms) R Lat (ms) L-R Lat (ms) L Amp (V) R Amp (V) L-R Amp (%) Site1 Site2 L Vel (m/s) R Vel (m/s) L-R Vel (m/s)  Median Acr Palm Anti Sensory (2nd Digit)  32.1C  Wrist *19.8 *7.0 12.8 *0.1 *6.9 98.6 Wrist Palm     Palm *6.4 *5.3 1.1 8.8 2.7 69.3       Radial Anti Sensory (Base 1st Digit)  31.7C  Wrist 2.3 2.4 0.1 9.0 13.3 32.3 Wrist Base 1st Digit     Ulnar Anti Sensory (5th Digit)  32.2C  Wrist 3.6 3.4 0.2 20.2 19.5 3.5 Wrist 5th Digit 39 41 2   Motor Left/Right Comparison   Stim Site L Lat (ms) R Lat (ms) L-R Lat (ms) L Amp (mV) R Amp (mV) L-R Amp (%) Site1 Site2 L Vel (m/s) R Vel (m/s) L-R Vel (m/s)  Median Motor (Abd Poll Brev)  32C  Wrist *10.2 *8.8 *1.4 *1.9 *0.2 *89.5 Elbow Wrist *40 *41 1  Elbow 15.9 14.7 1.2 1.3 0.3 76.9       Ulnar Motor (Abd Dig Min)  32C  Wrist 3.2 2.8 0.4 4.4 4.3 2.3 B Elbow Wrist 54 *48 6  B Elbow 7.3 7.4 0.1 4.4 4.1 6.8 A Elbow B Elbow *48 61 13  A Elbow 9.6 9.2 0.4 2.7 4.3 37.2          Waveforms:                      Clinical History: No specialty comments available.     Objective:  VS:  HT:    WT:   BMI:      BP:   HR: bpm  TEMP: ( )  RESP:  Physical Exam Musculoskeletal:        General: No tenderness.     Comments: Inspection reveals some atrophy flattening of the right and left APB but also some atrophy of the right FDI.  There is essential tremor. There is no swelling, color changes, allodynia or dystrophic changes. There is 5 out of 5 strength in the bilateral wrist extension, finger abduction and long finger flexion.  There is decreased sensation in the bilateral median nerve distribution.   There is a negative Hoffmann's test bilaterally.  Skin:    General: Skin is warm and dry.     Findings: No erythema or rash.  Neurological:     General: No focal deficit present.     Mental Status: He is alert and oriented to person, place, and time.     Sensory: No sensory deficit.     Motor: No weakness or abnormal muscle tone.     Coordination: Coordination normal.     Gait: Gait normal.  Psychiatric:        Mood and Affect: Mood normal.        Behavior: Behavior normal.        Thought Content: Thought content normal.      Imaging: No results  found.

## 2021-12-19 NOTE — Procedures (Signed)
EMG & NCV Findings: Evaluation of the left median motor and the right median motor nerves showed prolonged distal onset latency (L10.2, R8.8 ms), reduced amplitude (L1.9, R0.2 mV), and decreased conduction velocity (Elbow-Wrist, L40, R41 m/s).  The left ulnar motor nerve showed decreased conduction velocity (A Elbow-B Elbow, 48 m/s).  The right ulnar motor nerve showed decreased conduction velocity (B Elbow-Wrist, 48 m/s).  The left median (across palm) sensory and the right median (across palm) sensory nerves showed prolonged distal peak latency (Wrist, L19.8, R7.0 ms), reduced amplitude (L0.1, R6.9 V), and prolonged distal peak latency (Palm, L6.4, R5.3 ms).  All remaining nerves (as indicated in the following tables) were within normal limits.  Left vs. Right side comparison data for the median motor nerve indicates abnormal L-R latency difference (1.4 ms) and abnormal L-R amplitude difference (89.5 %).  All remaining left vs. right side differences were within normal limits.    Needle evaluation of the right abductor pollicis brevis muscle showed decreased insertional activity, increased spontaneous activity, and diminished recruitment.  The right first dorsal interosseous muscle showed slightly increased spontaneous activity.  All remaining muscles (as indicated in the following table) showed no evidence of electrical instability.    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  There is evidence of axonal injury and likely has some residual symptoms even with decompression.  EMG instability of the right FDI muscle along with some atrophy compared to the left could indicate a concomitant C8 radiculopathy but no other findings to confirm this.  Ulnar nerve on the right was normal.   There is no significant electrodiagnostic evidence of any other  focal nerve entrapment or brachial plexopathy.   Recommendations: 1.  Follow-up with referring physician. 2.  Continue current management of symptoms. 3.  Continue use of resting splint at night-time and as needed during the day. 4.  Suggest surgical evaluation.  ___________________________ Shane Casey FAAPMR Board Certified, American Board of Physical Medicine and Rehabilitation    Nerve Conduction Studies Anti Sensory Summary Table   Stim Site NR Peak (ms) Norm Peak (ms) P-T Amp (V) Norm P-T Amp Site1 Site2 Delta-P (ms) Dist (cm) Vel (m/s) Norm Vel (m/s)  Left Median Acr Palm Anti Sensory (2nd Digit)  32.1C  Wrist    *19.8 <3.6 *0.1 >10 Wrist Palm 13.4 0.0    Palm    *6.4 <2.0 8.8         Right Median Acr Palm Anti Sensory (2nd Digit)  31.9C  Wrist    *7.0 <3.6 *6.9 >10 Wrist Palm 1.7 0.0    Palm    *5.3 <2.0 2.7         Left Radial Anti Sensory (Base 1st Digit)  31.7C  Wrist    2.3 <3.1 9.0  Wrist Base 1st Digit 2.3 0.0    Right Radial Anti Sensory (Base 1st Digit)  31.7C  Wrist    2.4 <3.1 13.3  Wrist Base 1st Digit 2.4 0.0    Left Ulnar Anti Sensory (5th Digit)  32.2C  Wrist    3.6 <3.7 20.2 >15.0 Wrist 5th Digit 3.6 14.0 39 >38  Right Ulnar Anti Sensory (5th Digit)  32.2C  Wrist    3.4 <3.7 19.5 >15.0 Wrist 5th Digit 3.4 14.0 41 >38   Motor Summary Table   Stim Site NR Onset (ms) Norm Onset (ms) O-P Amp (mV) Norm  O-P Amp Site1 Site2 Delta-0 (ms) Dist (cm) Vel (m/s) Norm Vel (m/s)  Left Median Motor (Abd Poll Brev)  32C  Wrist    *10.2 <4.2 *1.9 >5 Elbow Wrist 5.7 23.0 *40 >50  Elbow    15.9  1.3         Right Median Motor (Abd Poll Brev)  31.8C  Wrist    *8.8 <4.2 *0.2 >5 Elbow Wrist 5.9 24.0 *41 >50  Elbow    14.7  0.3         Left Ulnar Motor (Abd Dig Min)  32C  Wrist    3.2 <4.2 4.4 >3 B Elbow Wrist 4.1 22.0 54 >53  B Elbow    7.3  4.4  A Elbow B Elbow 2.3 11.0 *48 >53  A Elbow    9.6  2.7         Right Ulnar Motor (Abd Dig Min)  31.7C  Wrist    2.8  <4.2 4.3 >3 B Elbow Wrist 4.6 22.0 *48 >53  B Elbow    7.4  4.1  A Elbow B Elbow 1.8 11.0 61 >53  A Elbow    9.2  4.3          EMG   Side Muscle Nerve Root Ins Act Fibs Psw Amp Dur Poly Recrt Int Dennie Bible Comment  Right Abd Poll Brev Median C8-T1 *Decr *3+ *3+ Nml Nml 0 *Reduced Nml active MUAps  Right 1stDorInt Ulnar C8-T1 Nml *1+ *1+ Nml Nml 0 Nml Nml   Right PronatorTeres Median C6-7 Nml Nml Nml Nml Nml 0 Nml Nml   Right Biceps Musculocut C5-6 Nml Nml Nml Nml Nml 0 Nml Nml   Right Deltoid Axillary C5-6 Nml Nml Nml Nml Nml 0 Nml Nml     Nerve Conduction Studies Anti Sensory Left/Right Comparison   Stim Site L Lat (ms) R Lat (ms) L-R Lat (ms) L Amp (V) R Amp (V) L-R Amp (%) Site1 Site2 L Vel (m/s) R Vel (m/s) L-R Vel (m/s)  Median Acr Palm Anti Sensory (2nd Digit)  32.1C  Wrist *19.8 *7.0 12.8 *0.1 *6.9 98.6 Wrist Palm     Palm *6.4 *5.3 1.1 8.8 2.7 69.3       Radial Anti Sensory (Base 1st Digit)  31.7C  Wrist 2.3 2.4 0.1 9.0 13.3 32.3 Wrist Base 1st Digit     Ulnar Anti Sensory (5th Digit)  32.2C  Wrist 3.6 3.4 0.2 20.2 19.5 3.5 Wrist 5th Digit 39 41 2   Motor Left/Right Comparison   Stim Site L Lat (ms) R Lat (ms) L-R Lat (ms) L Amp (mV) R Amp (mV) L-R Amp (%) Site1 Site2 L Vel (m/s) R Vel (m/s) L-R Vel (m/s)  Median Motor (Abd Poll Brev)  32C  Wrist *10.2 *8.8 *1.4 *1.9 *0.2 *89.5 Elbow Wrist *40 *41 1  Elbow 15.9 14.7 1.2 1.3 0.3 76.9       Ulnar Motor (Abd Dig Min)  32C  Wrist 3.2 2.8 0.4 4.4 4.3 2.3 B Elbow Wrist 54 *48 6  B Elbow 7.3 7.4 0.1 4.4 4.1 6.8 A Elbow B Elbow *48 61 13  A Elbow 9.6 9.2 0.4 2.7 4.3 37.2          Waveforms:

## 2022-02-26 ENCOUNTER — Ambulatory Visit: Payer: Medicare Other | Admitting: Sports Medicine

## 2022-02-26 VITALS — BP 120/60 | Ht 74.0 in | Wt 205.0 lb

## 2022-02-26 DIAGNOSIS — M25531 Pain in right wrist: Secondary | ICD-10-CM | POA: Diagnosis not present

## 2022-02-26 NOTE — Patient Instructions (Addendum)
It was wonderful to meet you today. Thank you for allowing me to be a part of your care. 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  I recommend continued use of ibuprofen continue physical therapy.  Please follow-up with Dr. Laroy Apple at your next visit with him to discuss   Please bring all of your medications to every appointment!  If you have any questions or concerns, please do not hesitate to contact us via phone or MyChart message.   Alen Bleacher, MD [facility]
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
## 2022-05-13 IMAGING — CR DG WRIST COMPLETE 3+V*R*
4 series · 4 of 4 positions shown · non-contrast
Comparison: None.

CLINICAL DATA: Right wrist pain.

EXAM:
RIGHT WRIST - COMPLETE 3+ VIEW

[x wrist pa right]
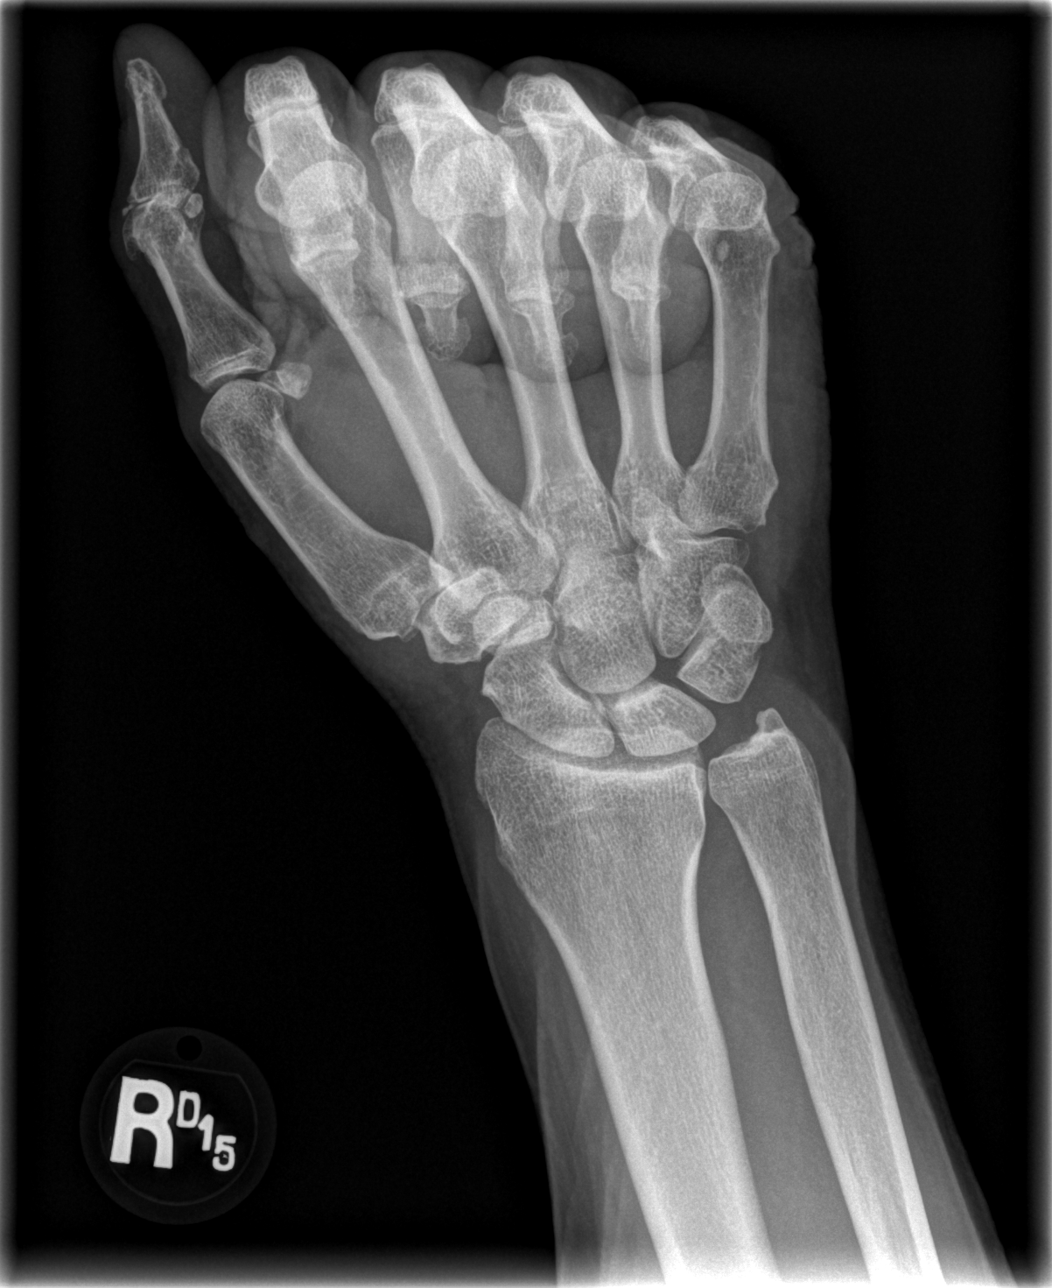

[x wrist obl right]
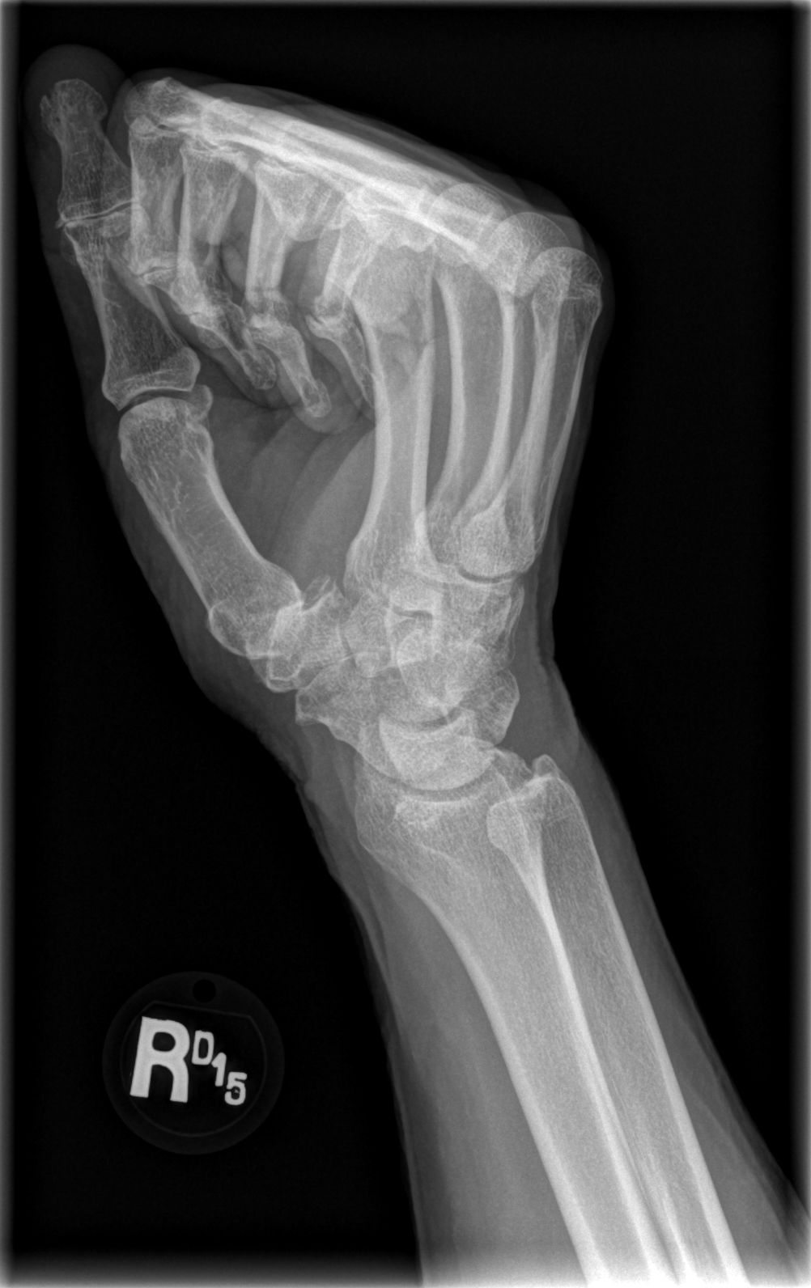

[x wrist lat right *]
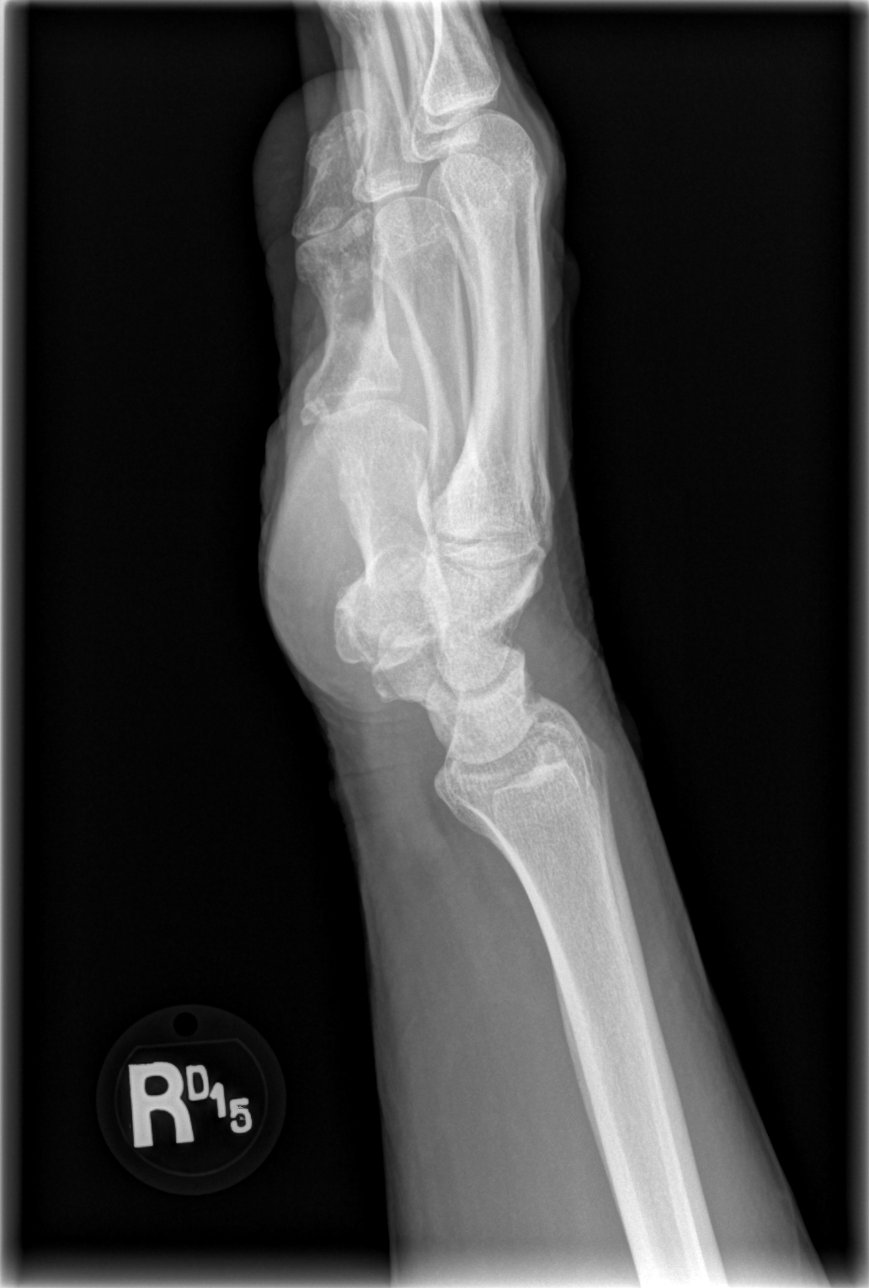

[x navicular *]
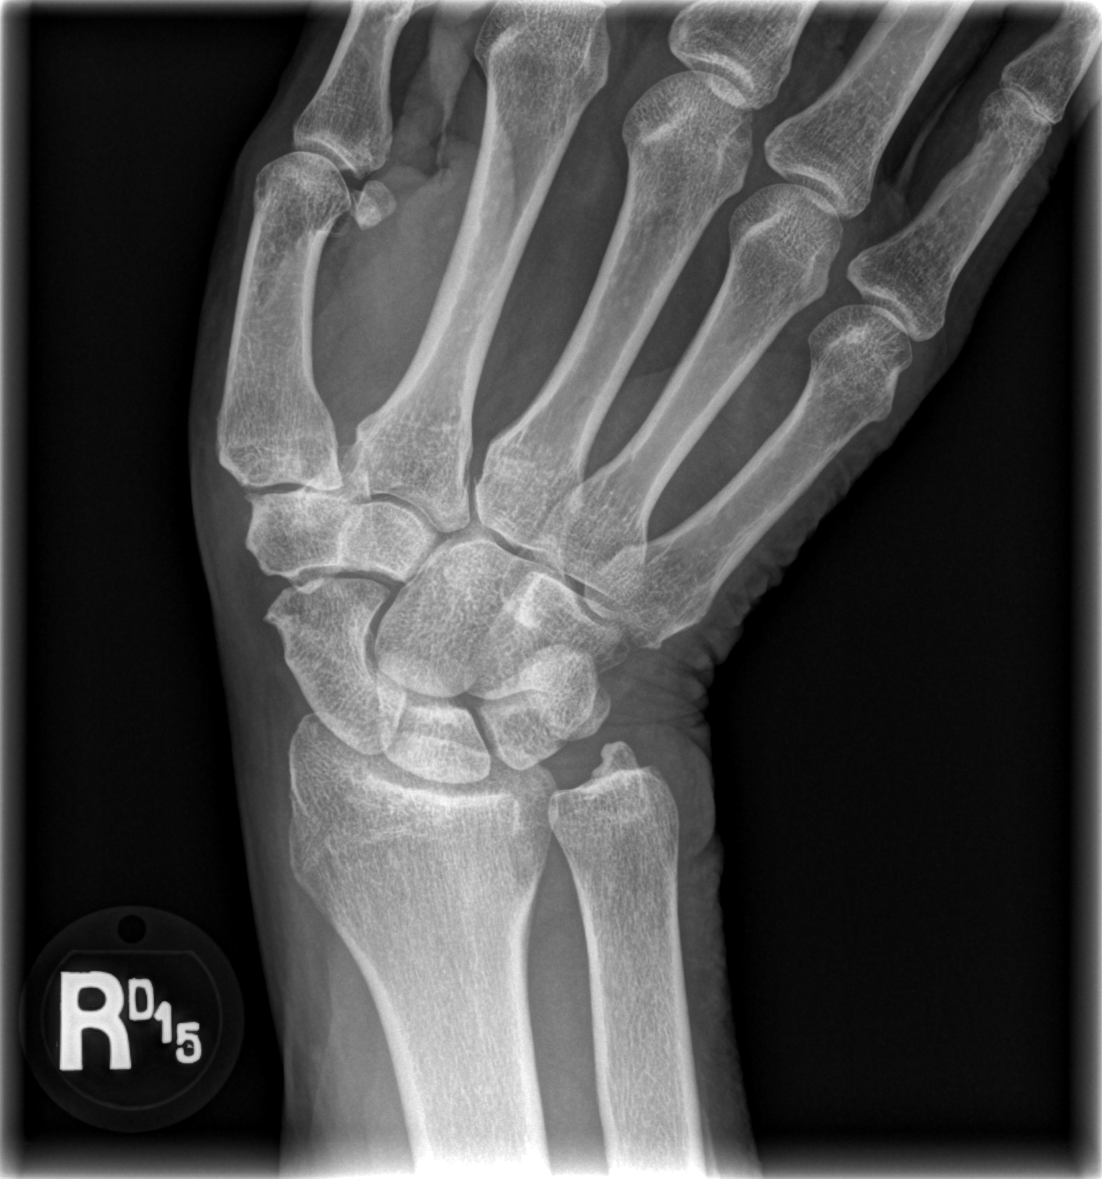

[4 of 4 positions shown; findings below may reference images not displayed]

FINDINGS: No acute fracture or dislocation. The bones are well mineralized.
Mild arthritic changes of the base of the thumb. The soft tissues
are unremarkable.
IMPRESSION: No acute findings.

## 2022-10-01 ENCOUNTER — Ambulatory Visit: Payer: Medicare Other | Admitting: Sports Medicine

## 2022-10-01 ENCOUNTER — Other Ambulatory Visit: Payer: Self-pay

## 2022-10-01 VITALS — BP 130/70 | Ht 74.0 in | Wt 200.0 lb

## 2022-10-01 DIAGNOSIS — M25512 Pain in left shoulder: Secondary | ICD-10-CM

## 2022-10-01 NOTE — Progress Notes (Addendum)
   Subjective:    Patient ID: Shane Casey, male    DOB: 13-Jan-1946, 77 y.o.   MRN: [id number]  HPI chief complaint: Left shoulder pain  Shane Casey presents today complaining of left shoulder pain.  Pain began acutely while working out last week.  He was performing a LAT pulldown when he felt sudden pain along the posterior lateral shoulder.  Since then he has had pain primarily late in the day and in the evening.  He has continued with his workouts.  He has not really noticed any weakness.  He denies any significant problems with the left shoulder in the past but has undergone right shoulder surgery in the past likely for rotator cuff repair.  He is using a natural anti-inflammatory for pain which does seem to be helping.  He did not notice any bruising or swelling after the injury.  Past medical history reviewed Medications reviewed Allergies reviewed   Review of Systems As above    Objective:   Physical Exam  Well-developed, well-nourished.  No acute distress  Left shoulder: Full active range of motion.  There is tenderness to palpation along the posterior lateral shoulder but no palpable defect.  No obvious ecchymosis or edema.  Negative empty can.  Positive Hawkins.  Rotator cuff strength is 5/5 including resisted supraspinatus, infraspinatus, and subscapularis.  Negative O'Brien's.  Left shoulder ultrasound: Biceps tendon was well-visualized within the bicipital groove in both long and short axis.  There is a small area of hypoechoic change deep to the tendon fibers which may represent some mild straining here.  Supraspinatus does show an area of hypoechoic change on both the long axis and short axis view just proximal to the attachment on the humerus.  There is also some cortical irregularity of the humeral head in this area.  Subscapularis and infraspinatus are both unremarkable.  AC joint shows some degenerative changes as well as a positive mushroom sign.  Glenohumeral joint is  unremarkable.  Findings are consistent with probable partial-thickness supraspinatus tendon tear.      Assessment & Plan:   Left shoulder pain with ultrasound evidence of probable partial supraspinatus tendon tear  Patient has full rotator cuff strength which is reassuring.  Ultrasound does suggest partial supraspinatus tendon tear.  We will start a home rehabilitation exercise consisting of Jobe exercises.  He will avoid any sort of overhead lifting in the gym.  He may continue with his anti-inflammatory medicine as needed.  Follow-up with me again in 3 weeks for reevaluation.  If symptoms are not improving then consider the addition of topical nitroglycerin patch at that time.  This note was dictated using Dragon naturally speaking software and may contain errors in syntax, spelling, or content which have not been identified prior to signing this note.

## 2022-10-22 ENCOUNTER — Ambulatory Visit: Payer: Medicare Other | Admitting: Sports Medicine

## 2022-10-22 VITALS — BP 128/72 | Ht 74.0 in | Wt 200.0 lb

## 2022-10-22 DIAGNOSIS — M25512 Pain in left shoulder: Secondary | ICD-10-CM

## 2022-10-22 NOTE — Progress Notes (Signed)
PCP: Barbie Banner, MD  Subjective:   HPI: Patient is a 77 y.o. male with PMH HTN, insulin-dependent T2DM, prior right rotator cuff repair here for follow-up of left shoulder pain.  He was seen in clinic on 10/01/2022 with chief complaint of left shoulder pain.  Pain started acutely when he was performing LAT pulldown.  He has had prior right shoulder pain and right rotator cuff repair but nothing on the left.  He is using a homeopathic anti-inflammatory medicine for pain which does help out a little bit.  He had ultrasound during that visit that showed partial supraspinatus tear and he was started on home rotator cuff exercises and he has been compliant with these.   He feels like his symptoms have been improving and he is slowly started to resume his regular activity in the gym.  He is avoiding any kind of overhead press, but is back to doing LAT pull downs and able to do his typical weight.  Counseled him on avoiding external rotation with LAT pull downs and keeping arms/elbows in front of him, would also be cautious with any kind of overhead press.        Objective:  BP 128/72   Ht 6\' 2"  (1.88 m)   Wt 200 lb (90.7 kg)   BMI 25.68 kg/m   Physical Exam:  Gen: NAD, comfortable in exam room  Left shoulder: No deformities, ecchymoses, edema or erythema.  Full AROM and PROM.  5/5 strength abduction and internal and external rotation.  No reproducible pain with internal and external rotation against resistance.  Mild pain with empty can test.  Negative crossarm test, negative lift off.   Assessment & Plan:  1.  Left shoulder pain/partial supraspinatus tear: Symptoms improving.  He will continue to incorporate rotator cuff exercises into his exercise regimen.  Provided counseling on appropriate form and caution with any kind of overhead exercise.  He can continue using homeopathic anti-inflammatories as needed.  Can follow-up as needed.  Patient seen and evaluated with the resident.  I  agree with the above plan of care.  Alcide's symptoms are improving.  Therefore, we will hold on any further treatment at this time.  He does understand the importance of incorporating his rotator cuff exercises into his weekly workouts.  He will follow-up as needed.  This note was dictated using Dragon naturally speaking software and may contain errors in syntax, spelling, or content which have not been identified prior to signing this note.

## 2023-04-22 ENCOUNTER — Encounter: Payer: Self-pay | Admitting: Family Medicine

## 2023-04-22 ENCOUNTER — Ambulatory Visit: Payer: Medicare Other | Admitting: Family Medicine

## 2023-04-22 ENCOUNTER — Ambulatory Visit
Admission: RE | Admit: 2023-04-22 | Discharge: 2023-04-22 | Disposition: A | Discharge status: Home / Self Care | Payer: Medicare Other | Source: Ambulatory Visit | Attending: Family Medicine | Admitting: Family Medicine

## 2023-04-22 VITALS — BP 114/72 | Ht 74.0 in | Wt 200.0 lb

## 2023-04-22 DIAGNOSIS — M79671 Pain in right foot: Secondary | ICD-10-CM

## 2023-04-22 DIAGNOSIS — M25561 Pain in right knee: Secondary | ICD-10-CM

## 2023-04-22 NOTE — Patient Instructions (Signed)
I placed an order for xrays of your right foot, ankle, and knee - I will reach out with the results once available - you can ice as needed - you can continue Tylenol as needed - we will discuss further follow-up after your xrays are completed

## 2023-04-22 NOTE — Progress Notes (Signed)
Reviewed Rt knee, Rt foot, Rt ankle xrays.  Tried calling patient - no answer.  LMOM indicating MyChart message will be sent.    Ankle with possible small avulsion fracture, otherwise not other fractures.  Suspect avulsion injury and foot sprain/contusion as cause of his pain.  Cont symptomatic treatment.  Continue to limit activity.  Will offer camboot if having increased pain.  F/u 1-2 weeks to reassess, sooner prn

## 2023-04-22 NOTE — Progress Notes (Signed)
DATE OF VISIT: 04/22/2023        Shane Casey DOB: 07-Sep-1945 MRN: [id number]  CC:  Rt Knee pain and RT foot pain  History- Shane Casey is a 77 y.o. dominant male for evaluation and treatment of Rt foot and knee pain s/p fall.  Fall off step ladder on Saturday Landed on right side Unsure how he injured the knee or the foot, unsure if he hit them again something or if they got caught under him - landed on shoulder  - Hx Rt shoulder RTC tear s/p repair Rt knee and foot currently most bothersome (+)swelling in the foot Pain worst along 1st MTP No previous right foot issues  RT Knee is crunching when walks - notes no ACL and no meniscus on the right  - no recent imaging No swelling  Tried Tylenol - last was last night  - helps with pain No ice or heat   Past Medical History Past Medical History:  Diagnosis Date   ADD (attention deficit disorder) without hyperactivity    Anxiety    Depression    DM type 2 (diabetes mellitus, type 2) (HCC)    GERD (gastroesophageal reflux disease)    IBS (irritable bowel syndrome)     Past Surgical History Past Surgical History:  Procedure Laterality Date   APPENDECTOMY     COLONSCOPY     X2   KNEE SURGERY     X 3   SHOULDER SURGERY  2014   ROTATOR CUFF REPAIR    Medications Current Outpatient Medications  Medication Sig Dispense Refill   ALPRAZolam (XANAX) 0.25 MG tablet Take 0.125 mg by mouth at bedtime as needed. For sleep  2   calcium carbonate (TUMS EX) 750 MG chewable tablet Chew 2 tablets by mouth 2 (two) times daily as needed for heartburn.     HUMALOG MIX 75/25 KWIKPEN (75-25) 100 UNIT/ML Kwikpen Inject 10 Units into the skin every morning.  2   Insulin Glargine (LANTUS SOLOSTAR) 100 UNIT/ML Solostar Pen Inject 28 Units into the skin daily at 10 pm. (Patient taking differently: Inject 30 Units into the skin daily at 10 pm.) 5 pen 2   insulin lispro (HUMALOG KWIKPEN) 100 UNIT/ML KiwkPen Inject 0.04-0.06 mLs (4-6 Units  total) into the skin 3 (three) times daily. 15 mL 2   Insulin Pen Needle (CAREFINE PEN NEEDLES) 32G X 4 MM MISC Use 3x a day 200 each 11   lisinopril (PRINIVIL,ZESTRIL) 20 MG tablet Take 20 mg by mouth daily.  3   methylphenidate (RITALIN) 10 MG tablet Take 1 tablet (10 mg total) by mouth 2 (two) times daily with breakfast and lunch.     sildenafil (VIAGRA) 100 MG tablet Take 100 mg by mouth daily as needed for erectile dysfunction.     tamsulosin (FLOMAX) 0.4 MG CAPS capsule Take 0.4 mg by mouth daily.     tiZANidine (ZANAFLEX) 4 MG tablet Take 2-4 mg by mouth every 6 (six) hours as needed for muscle spasms.      No current facility-administered medications for this visit.    Allergies is allergic to egg-derived products, gluten meal, lipitor [atorvastatin], metformin and related, milk (cow), penicillin g, pineapple, and vegetable oil.  Family History - reviewed per EMR and intake form  Social History   reports no history of alcohol use.  reports that he has quit smoking. His smoking use included cigarettes. He has a 25 pack-year smoking history. He does not have any smokeless tobacco history on file.  reports no history of drug use. From [place] originally, lived in [place], [place] for some time.  Has been in [place] area since 2006   EXAM: Vitals: BP 114/72   Ht 6\' 2"  (1.88 m)   Wt 200 lb (90.7 kg)   BMI 25.68 kg/m  General: AOx3, NAD, pleasant SKIN: no rashes or lesions, skin clean, dry, intact MSK: Right knee with small amount of soft tissue swelling, no increased redness or warmth, trace effusion.  No visible bruising.  Tender to palpation along the lateral joint line, mild tenderness along the medial joint line.  Positive Lachman with loose ACL endpoint.  Negative posterior drawer, negative varus and valgus stress, negative McMurray. Left knee with full range of motion without pain or weakness.  Right foot and ankle with prominent soft tissue swelling along the dorsum of  the foot.  Has ecchymosis along the second and third MTPs, as well as faint ecchymosis along the first MTP.  Significant tenderness to palpation along the second, third metatarsals.  No palpable step-off or deformity.  Mild tenderness along the first and fifth metatarsals as well. Right ankle with mild tenderness along the lateral malleolus, no tenderness along the medial malleolus.  Negative anterior drawer, negative talar tilt.  Slight tenderness along the fibular head.  Negative syndesmotic squeeze. Left ankle with full range of motion without pain, weakness, instability Walking with antalgic gait  NEURO: sensation intact to light touch lower extremity bilaterally VASC: pulses 2+ and symmetric PT/PT bilaterally, 1-2+ pitting edema to mid calf lower extremity bilaterally   Assessment & Plan Foot pain, right Right dorsal foot pain along the midfoot with significant swelling and bruising status post fall from ladder 5 days ago, concern for possible fracture  Plan: -Imaging: STAT right foot and ankle x-rays to rule out possible fracture -Continue Tylenol as needed -Ice as needed -Try to limit walking and activity until we obtain x-ray results.  Will discuss further follow-up and treatment pending results Acute pain of right knee Acute right knee pain status post fall from ladder 5 days ago, concern for possible occult fracture versus exacerbation of underlying osteoarthritis  Plan: -Imaging: STAT right knee x-ray to rule out acute bony abnormality -Continue Tylenol as needed -Ice as needed -Try to limit walking and activity until we obtain x-ray results.  Will discuss further follow-up and treatment pending results  Patient expressed understanding & agreement with above.  Encounter Diagnoses  Name Primary?   Foot pain, right Yes   Acute pain of right knee     Orders Placed This Encounter  Procedures   DG Foot Complete Right   DG Ankle Complete Right   DG Knee Complete 4 Views  Right    Orders Placed This Encounter  Procedures   DG Foot Complete Right   DG Ankle Complete Right   DG Knee Complete 4 Views Right
